# Patient Record
Sex: Female | Born: 1955 | Race: Black or African American | Hispanic: No | Smoking: Never smoker
Health system: Southern US, Community
[De-identification: ages and names within clinical notes are randomized; demographics above are authoritative.]

## PROBLEM LIST (undated history)

## (undated) DIAGNOSIS — I10 Essential (primary) hypertension: Secondary | ICD-10-CM

## (undated) DIAGNOSIS — E119 Type 2 diabetes mellitus without complications: Secondary | ICD-10-CM

## (undated) DIAGNOSIS — E079 Disorder of thyroid, unspecified: Secondary | ICD-10-CM

## (undated) HISTORY — PX: OTHER SURGICAL HISTORY: SHX169

---

## 2018-12-29 ENCOUNTER — Encounter (HOSPITAL_COMMUNITY): Payer: Self-pay

## 2018-12-29 ENCOUNTER — Emergency Department (HOSPITAL_COMMUNITY): Payer: 59

## 2018-12-29 ENCOUNTER — Emergency Department (HOSPITAL_COMMUNITY)
Admission: EM | Admit: 2018-12-29 | Discharge: 2018-12-29 | Disposition: A | Discharge status: Home / Self Care | Payer: 59 | Attending: Emergency Medicine | Admitting: Emergency Medicine

## 2018-12-29 DIAGNOSIS — Z79899 Other long term (current) drug therapy: Secondary | ICD-10-CM | POA: Diagnosis not present

## 2018-12-29 DIAGNOSIS — E119 Type 2 diabetes mellitus without complications: Secondary | ICD-10-CM | POA: Diagnosis not present

## 2018-12-29 DIAGNOSIS — I120 Hypertensive chronic kidney disease with stage 5 chronic kidney disease or end stage renal disease: Secondary | ICD-10-CM | POA: Diagnosis not present

## 2018-12-29 DIAGNOSIS — Z944 Liver transplant status: Secondary | ICD-10-CM | POA: Insufficient documentation

## 2018-12-29 DIAGNOSIS — R51 Headache: Secondary | ICD-10-CM | POA: Insufficient documentation

## 2018-12-29 DIAGNOSIS — Z794 Long term (current) use of insulin: Secondary | ICD-10-CM | POA: Insufficient documentation

## 2018-12-29 DIAGNOSIS — N186 End stage renal disease: Secondary | ICD-10-CM | POA: Insufficient documentation

## 2018-12-29 DIAGNOSIS — R519 Headache, unspecified: Secondary | ICD-10-CM

## 2018-12-29 HISTORY — DX: Essential (primary) hypertension: I10

## 2018-12-29 LAB — CBC WITH DIFFERENTIAL/PLATELET
Abs Immature Granulocytes: 0.03 10*3/uL (ref 0.00–0.07)
Basophils Absolute: 0 10*3/uL (ref 0.0–0.1)
Basophils Relative: 0 %
Eosinophils Absolute: 0 10*3/uL (ref 0.0–0.5)
Eosinophils Relative: 1 %
HCT: 33.9 % — ABNORMAL LOW (ref 36.0–46.0)
HEMOGLOBIN: 9.8 g/dL — AB (ref 12.0–15.0)
Immature Granulocytes: 1 %
Lymphocytes Relative: 16 %
Lymphs Abs: 0.8 10*3/uL (ref 0.7–4.0)
MCH: 22 pg — ABNORMAL LOW (ref 26.0–34.0)
MCHC: 28.9 g/dL — ABNORMAL LOW (ref 30.0–36.0)
MCV: 76 fL — ABNORMAL LOW (ref 80.0–100.0)
MONO ABS: 0.3 10*3/uL (ref 0.1–1.0)
MONOS PCT: 5 %
Neutro Abs: 4 10*3/uL (ref 1.7–7.7)
Neutrophils Relative %: 77 %
Platelets: 172 10*3/uL (ref 150–400)
RBC: 4.46 MIL/uL (ref 3.87–5.11)
RDW: 15.9 % — ABNORMAL HIGH (ref 11.5–15.5)
WBC: 5.2 10*3/uL (ref 4.0–10.5)
nRBC: 0 % (ref 0.0–0.2)

## 2018-12-29 LAB — COMPREHENSIVE METABOLIC PANEL
ALK PHOS: 67 U/L (ref 38–126)
ALT: 14 U/L (ref 0–44)
AST: 17 U/L (ref 15–41)
Albumin: 3.2 g/dL — ABNORMAL LOW (ref 3.5–5.0)
Anion gap: 8 (ref 5–15)
BILIRUBIN TOTAL: 0.5 mg/dL (ref 0.3–1.2)
BUN: 17 mg/dL (ref 8–23)
CALCIUM: 8.7 mg/dL — AB (ref 8.9–10.3)
CO2: 21 mmol/L — ABNORMAL LOW (ref 22–32)
Chloride: 110 mmol/L (ref 98–111)
Creatinine, Ser: 2.35 mg/dL — ABNORMAL HIGH (ref 0.44–1.00)
GFR calc Af Amer: 25 mL/min — ABNORMAL LOW (ref >60)
GFR calc non Af Amer: 21 mL/min — ABNORMAL LOW (ref >60)
Glucose, Bld: 141 mg/dL — ABNORMAL HIGH (ref 70–99)
Potassium: 4.9 mmol/L (ref 3.5–5.1)
Sodium: 139 mmol/L (ref 135–145)
Total Protein: 7.2 g/dL (ref 6.5–8.1)

## 2018-12-29 LAB — TSH: TSH: 0.818 u[IU]/mL (ref 0.350–4.500)

## 2018-12-29 MED ORDER — TETRACAINE HCL 0.5 % OP SOLN
2.0000 [drp] | Freq: Once | OPHTHALMIC | Status: AC
  Administered 2018-12-29: 2 [drp] via OPHTHALMIC
  Filled 2018-12-29: qty 4

## 2018-12-29 MED ORDER — AMLODIPINE BESYLATE 5 MG PO TABS
10.0000 mg | ORAL_TABLET | Freq: Once | ORAL | Status: AC
  Administered 2018-12-29: 10 mg via ORAL
  Filled 2018-12-29: qty 2

## 2018-12-29 MED ORDER — DIPHENHYDRAMINE HCL 50 MG/ML IJ SOLN
12.5000 mg | Freq: Once | INTRAMUSCULAR | Status: AC
  Administered 2018-12-29: 12.5 mg via INTRAVENOUS
  Filled 2018-12-29: qty 1

## 2018-12-29 MED ORDER — LISINOPRIL 10 MG PO TABS
10.0000 mg | ORAL_TABLET | Freq: Once | ORAL | Status: AC
  Administered 2018-12-29: 10 mg via ORAL
  Filled 2018-12-29: qty 1

## 2018-12-29 MED ORDER — METOCLOPRAMIDE HCL 5 MG/ML IJ SOLN
10.0000 mg | Freq: Once | INTRAMUSCULAR | Status: AC
  Administered 2018-12-29: 10 mg via INTRAVENOUS
  Filled 2018-12-29: qty 2

## 2018-12-29 NOTE — ED Notes (Signed)
Pt called for room x1 with no answer

## 2018-12-29 NOTE — ED Notes (Signed)
Patient transported to MRI 

## 2018-12-29 NOTE — ED Provider Notes (Signed)
[facility] EMERGENCY DEPARTMENT Provider Note   CSN: [id number] Arrival date & time: 12/29/18  4970   History   Chief Complaint Chief Complaint  Patient presents with  . Migraine    HPI Tammy Wilcox is a 63 y.o. 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 Patient states light and noise makes the headache worse. Patient reports nothing alleviates the pain. Patient states she has tried tylenol without relief. Patient reports blurry vision in right eye. Patient denies any triggers. Patient states she has not had a headache in 20 years prior to this event. Patient denies trouble ambulating, weakness, numbness, or neck pain. Patient denies nausea, vomiting, or abdominal pain.   HPI  Past Medical History:  Diagnosis Date  . Hypertension     There are no active problems to display for this patient.   History reviewed. No pertinent surgical history.   OB History   No obstetric history on file.      Home Medications    Prior to Admission medications   Medication Sig Start Date End Date Taking? Authorizing Provider  amLODipine (NORVASC) 10 MG tablet Take 10 mg by mouth every evening.   Yes [provider]  atorvastatin (LIPITOR) 10 MG tablet Take 10 mg by mouth daily.   Yes [provider]  bimatoprost (LUMIGAN) 0.01 % SOLN Place 1 drop into both eyes at bedtime.   Yes [provider]  cloNIDine (CATAPRES) 0.1 MG tablet Take 0.1 mg by mouth 2 (two) times daily.   Yes [provider]  cycloSPORINE modified (NEORAL) 25 MG capsule Take 75 mg by mouth every 12 (twelve) hours.   Yes [provider]  DULoxetine (CYMBALTA) 30 MG capsule Take 30 mg by mouth daily.   Yes [provider]  furosemide (LASIX) 40 MG tablet Take 40 mg by  mouth daily.   Yes [provider]  gabapentin (NEURONTIN) 600 MG tablet Take 600 mg by mouth 3 (three) times daily. 09/02/17  Yes [provider]  hydrALAZINE (APRESOLINE) 10 MG tablet Take 10 mg by mouth 3 (three) times daily.   Yes [provider]  insulin glargine (LANTUS) 100 UNIT/ML injection Inject 20 Units into the skin at bedtime.   Yes [provider]  insulin lispro (HUMALOG KWIKPEN) 100 UNIT/ML KwikPen Inject 8 Units into the skin 3 (three) times daily with meals.   Yes [provider]  levothyroxine (SYNTHROID, LEVOTHROID) 112 MCG tablet Take 112 mcg by mouth every morning.   Yes [provider]  lisinopril (PRINIVIL,ZESTRIL) 10 MG tablet Take 10 mg by mouth daily.   Yes [provider]  mycophenolate (MYFORTIC) 180 MG EC tablet Take 360 mg by mouth 2 (two) times daily.   Yes [provider]  omeprazole (PRILOSEC) 20 MG capsule Take 20 mg by mouth daily.   Yes [provider]  tiZANidine (ZANAFLEX) 4 MG tablet Take 4 mg by mouth every 6 (six) hours.   Yes [provider]  traMADol (ULTRAM) 50 MG tablet Take 50 mg by mouth every 6 (six) hours.   Yes [provider]    Family History History reviewed. No pertinent family history.  Social History Social History   Tobacco Use  . Smoking status: Unknown If Ever Smoked  . Smokeless tobacco: Never Used  Substance  Use Topics  . Alcohol use: Never    Frequency: Never  . Drug use: Never     Allergies   Patient has no known allergies.   Review of Systems Review of Systems  Constitutional: Negative for activity change, appetite change, chills, diaphoresis, fatigue, fever and unexpected weight change.  HENT: Negative for congestion, ear pain, sinus pressure and sore throat.   Eyes: Positive for photophobia and visual disturbance.  Respiratory: Negative for shortness of breath.   Cardiovascular: Negative for chest pain.    Gastrointestinal: Negative for abdominal pain, nausea and vomiting.  Musculoskeletal: Negative for gait problem, myalgias, neck pain and neck stiffness.  Skin: Negative for rash.  Allergic/Immunologic: Positive for immunocompromised state.  Neurological: Positive for headaches. Negative for dizziness, seizures, syncope, speech difficulty, weakness and numbness.  Hematological: Does not bruise/bleed easily.  Psychiatric/Behavioral: Negative for sleep disturbance. The patient is not nervous/anxious.     Physical Exam Updated Vital Signs BP (!) 173/77   Pulse (!) 51   Temp 98 F (36.7 C) (Oral)   Resp 18   SpO2 100%   Physical Exam Vitals signs and nursing note reviewed.  Constitutional:      General: She is not in acute distress.    Appearance: She is well-developed. She is not diaphoretic.  HENT:     Head: Normocephalic and atraumatic. No right periorbital erythema or left periorbital erythema.     Right Ear: Tympanic membrane, ear canal and external ear normal.     Left Ear: Tympanic membrane, ear canal and external ear normal.     Nose: Nose normal. No congestion or rhinorrhea.     Mouth/Throat:     Mouth: Mucous membranes are moist.     Pharynx: No oropharyngeal exudate or posterior oropharyngeal erythema.  Eyes:     General:        Right eye: No discharge.        Left eye: No discharge.     Conjunctiva/sclera: Conjunctivae normal.     Right eye: Right conjunctiva is not injected.     Left eye: Left conjunctiva is not injected.  Cardiovascular:     Rate and Rhythm: Normal rate and regular rhythm.     Heart sounds: Normal heart sounds. No murmur. No friction rub. No gallop.   Pulmonary:     Effort: Pulmonary effort is normal. No respiratory distress.     Breath sounds: Normal breath sounds. No wheezing or rales.  Musculoskeletal: Normal range of motion.  Skin:    General: Skin is warm.     Findings: No erythema or rash.  Neurological:     Mental Status: She is  alert and oriented to person, place, and time.   Mental Status:  Alert, oriented, thought content appropriate, able to give a coherent history. Speech fluent without evidence of aphasia. Able to follow 2 step commands without difficulty.  Cranial Nerves:  II:  Peripheral visual fields grossly normal, pupils equal, round, reactive to light III,IV, VI: ptosis not present, EOMs intact. V,VII: smile symmetric, facial light touch sensation equal VIII: hearing grossly normal to voice  X: uvula elevates symmetrically  XI: bilateral shoulder shrug symmetric and strong XII: midline tongue extension without fassiculations Motor:  Normal tone. 5/5 in upper and lower extremities bilaterally including strong and equal grip strength and dorsiflexion/plantar flexion Sensory: light touch normal in all extremities.  Deep Tendon Reflexes: 2+ and symmetric in the biceps and patella Cerebellar: normal finger-to-nose with bilateral upper extremities Gait: normal gait  and balance.  Negative pronator drift. Negative Romberg sign. CV: distal pulses palpable throughout    ED Treatments / Results  Labs (all labs ordered are listed, but only abnormal results are displayed) Labs Reviewed  CBC WITH DIFFERENTIAL/PLATELET - Abnormal; Notable for the following components:      Result Value   Hemoglobin 9.8 (*)    HCT 33.9 (*)    MCV 76.0 (*)    MCH 22.0 (*)    MCHC 28.9 (*)    RDW 15.9 (*)    All other components within normal limits  COMPREHENSIVE METABOLIC PANEL - Abnormal; Notable for the following components:   CO2 21 (*)    Glucose, Bld 141 (*)    Creatinine, Ser 2.35 (*)    Calcium 8.7 (*)    Albumin 3.2 (*)    GFR calc non Af Amer 21 (*)    GFR calc Af Amer 25 (*)    All other components within normal limits  TSH    EKG None  Radiology Ct Head Wo Contrast  Result Date: 12/29/2018 CLINICAL DATA:  Right-sided headache for 2-3 weeks. Right-sided blurred vision. EXAM: CT HEAD WITHOUT CONTRAST  TECHNIQUE: Contiguous axial images were obtained from the base of the skull through the vertex without intravenous contrast. COMPARISON:  None. FINDINGS: Brain: No evidence of acute infarction, hemorrhage, hydrocephalus, extra-axial collection or mass lesion/mass effect. The pituitary gland appears fatty replaced. Vascular: Calcific atherosclerotic disease of the intra cavernous carotid arteries. Skull: Normal. Negative for fracture or focal lesion. Sinuses/Orbits: No acute finding. Other: None. IMPRESSION: No evidence of acute infarction or intracranial hemorrhage. The pituitary gland appears fatty replaced. Further evaluation with dedicated brain MRI may be considered. Calcific atherosclerotic disease of the intra cavernous carotid arteries. Electronically Signed   By: Fidela Salisbury M.D.   On: 12/29/2018 11:31   Mr Brain Wo Contrast (neuro Protocol)  Result Date: 12/29/2018 CLINICAL DATA:  Headache for 2 weeks. History of migraines and hypertension. EXAM: MRI HEAD WITHOUT CONTRAST MRA HEAD WITHOUT CONTRAST TECHNIQUE: Multiplanar, multiecho pulse sequences of the brain and surrounding structures were obtained without intravenous contrast. Angiographic images of the head were obtained using MRA technique without contrast. COMPARISON:  Head CT 12/29/2018 FINDINGS: MRI HEAD FINDINGS Brain: There is no evidence of acute infarct, intracranial hemorrhage, mass, midline shift, or extra-axial fluid collection. The ventricles and sulci are within normal limits for age. Scattered small foci of T2 hyperintensity in the cerebral white matter and pons are nonspecific but compatible with minimal chronic small vessel ischemic disease. There is a moderately expanded partially empty sella which accounts for the abnormal appearance on earlier CT. Vascular: Major intracranial vascular flow voids are preserved. Skull and upper cervical spine: Unremarkable bone marrow signal. Sinuses/Orbits: Bilateral exophthalmos. Small left  mastoid effusion. Other: None. MRA HEAD FINDINGS The visualized distal vertebral arteries are widely patent to the basilar and codominant. Patent P ICAs are partially imaged bilaterally. AICAs are small and not well evaluated. SCAs are patent bilaterally. The basilar artery is patent without stenosis. There are moderately large posterior communicating arteries bilaterally. Both PCAs are patent without evidence of significant stenosis. The internal carotid arteries are widely patent from skull base to carotid termini. ACAs and MCAs are patent without evidence of proximal branch occlusion or significant stenosis. No aneurysm is identified. IMPRESSION: 1. No acute intracranial abnormality. 2. Minimal chronic small vessel ischemic disease. 3. Partially empty sella, often an incidental finding though can be seen with idiopathic intracranial hypertension. 4. Negative  head MRA. Electronically Signed   By: Logan Bores M.D.   On: 12/29/2018 16:54   Mr Jodene Nam Head (cerebral Arteries)  Result Date: 12/29/2018 CLINICAL DATA:  Headache for 2 weeks. History of migraines and hypertension. EXAM: MRI HEAD WITHOUT CONTRAST MRA HEAD WITHOUT CONTRAST TECHNIQUE: Multiplanar, multiecho pulse sequences of the brain and surrounding structures were obtained without intravenous contrast. Angiographic images of the head were obtained using MRA technique without contrast. COMPARISON:  Head CT 12/29/2018 FINDINGS: MRI HEAD FINDINGS Brain: There is no evidence of acute infarct, intracranial hemorrhage, mass, midline shift, or extra-axial fluid collection. The ventricles and sulci are within normal limits for age. Scattered small foci of T2 hyperintensity in the cerebral white matter and pons are nonspecific but compatible with minimal chronic small vessel ischemic disease. There is a moderately expanded partially empty sella which accounts for the abnormal appearance on earlier CT. Vascular: Major intracranial vascular flow voids are preserved.  Skull and upper cervical spine: Unremarkable bone marrow signal. Sinuses/Orbits: Bilateral exophthalmos. Small left mastoid effusion. Other: None. MRA HEAD FINDINGS The visualized distal vertebral arteries are widely patent to the basilar and codominant. Patent P ICAs are partially imaged bilaterally. AICAs are small and not well evaluated. SCAs are patent bilaterally. The basilar artery is patent without stenosis. There are moderately large posterior communicating arteries bilaterally. Both PCAs are patent without evidence of significant stenosis. The internal carotid arteries are widely patent from skull base to carotid termini. ACAs and MCAs are patent without evidence of proximal branch occlusion or significant stenosis. No aneurysm is identified. IMPRESSION: 1. No acute intracranial abnormality. 2. Minimal chronic small vessel ischemic disease. 3. Partially empty sella, often an incidental finding though can be seen with idiopathic intracranial hypertension. 4. Negative head MRA. Electronically Signed   By: Logan Bores M.D.   On: 12/29/2018 16:54    Procedures Procedures (including critical care time)  Medications Ordered in ED Medications  lisinopril (PRINIVIL,ZESTRIL) tablet 10 mg (10 mg Oral Given 12/29/18 1249)  amLODipine (NORVASC) tablet 10 mg (10 mg Oral Given 12/29/18 1249)  metoCLOPramide (REGLAN) injection 10 mg (10 mg Intravenous Given 12/29/18 1306)  diphenhydrAMINE (BENADRYL) injection 12.5 mg (12.5 mg Intravenous Given 12/29/18 1305)  tetracaine (PONTOCAINE) 0.5 % ophthalmic solution 2 drop (2 drops Both Eyes Given 12/29/18 1342)     Initial Impression / Assessment and Plan / ED Course  I have reviewed the triage vital signs and the nursing notes.  Pertinent labs & imaging results that were available during my care of the patient were reviewed by me and considered in my medical decision making (see chart for details).  Clinical Course as of Dec 29 1710  Thu Dec 29, 2018  1221 BP  elevated. Patient reports she has not taken her BP medications today.   BP(!): 164/76 [AH]  1222 No evidence of acute infarction or intracranial hemorrhage on head CT.  CT Head Wo Contrast [AH]  1334 Tonometry reveals a pressure of 14, 16 on Right eye and 16, 18 on Left eye.    [AH]  1335 Pt reports symptoms have improved while in the ER.   [AH]  1507 Elevated creatinine likely due to ESRD. Patient is being followed by her nephrologist.  Creatinine(!): 2.35 [AH]  1658 Negative head MRA.  MR MRA Head (cerebral arteries) [AH]  1658 No acute intracranial abnormality.  MR Brain Wo Contrast (neuro protocol) [AH]    Clinical Course User Index [AH] Jerilee Hoh, Geroge Gilliam P, PA-C   Pt HA treated  and improved while in ED.  Presentation is like pts migraine and non concerning for [facility], ICH, Meningitis, or temporal arteritis. CT head does not reveal acute infarction or intracranial hemorrhage. MRI reveals no acute intracranial abnormality and MRA is negative. Pt is afebrile with no focal neuro deficits, or nuchal rigidity. Pt is to follow up with PCP to discuss prophylactic medication. Pt verbalizes understanding and is agreeable with plan to dc.   Findings and plan of care discussed with supervising physician Dr. Johnney Killian who personally evaluated and examined this patient.  Findings and plan of care also discussed with supervising physician Dr. Melina Copa.  Final Clinical Impressions(s) / ED Diagnoses   Final diagnoses:  Bad headache    ED Discharge Orders    None       Arville Lime, [place] 12/29/18 1712    Hayden Rasmussen, MD 12/29/18 2320

## 2018-12-29 NOTE — ED Triage Notes (Signed)
Pt endorses migraine x 2 weeks. Has hx of migraines but hasn't had one in a long time. Endorses light sensitivity. No neuro deficits. Hypertensive and has hx of same.

## 2018-12-29 NOTE — Discharge Instructions (Addendum)
You have been seen today for headache. Please read and follow all provided instructions.   1. Medications: usual home medications 2. Treatment: rest, drink plenty of fluids 3. Follow Up: Please follow up with your primary doctor in 2 days for discussion of your diagnoses and further evaluation after today's visit; if you do not have a primary care doctor use the resource guide provided to find one; Please return to the ER for any new or worsening symptoms. Please obtain all of your results from medical records or have your doctors office obtain the results - share them with your doctor - you should be seen at your doctors office. Call today to arrange your follow up.   Take medications as prescribed. Please review all of the medicines and only take them if you do not have an allergy to them. Return to the emergency room for worsening condition or new concerning symptoms. Follow up with your regular doctor. If you don't have a regular doctor use one of the numbers below to establish a primary care doctor. ?  It is also a possibility that you have an allergic reaction to any of the medicines that you have been prescribed - Everybody reacts differently to medications and while MOST people have no trouble with most medicines, you may have a reaction such as nausea, vomiting, rash, swelling, shortness of breath. If this is the case, please stop taking the medicine immediately and contact your physician.  ?  You should return to the ER if you develop severe or worsening symptoms.   Emergency Department Resource Guide 1) Find a Doctor and Pay Out of Pocket Although you won't have to find out who is covered by your insurance plan, it is a good idea to ask around and get recommendations. You will then need to call the office and see if the doctor you have chosen will accept you as a new patient and what types of options they offer for patients who are self-pay. Some doctors offer discounts or will set up  payment plans for their patients who do not have insurance, but you will need to ask so you aren't surprised when you get to your appointment.  2) Contact Your Local Health Department Not all health departments have doctors that can see patients for sick visits, but many do, so it is worth a call to see if yours does. If you don't know where your local health department is, you can check in your phone book. The CDC also has a tool to help you locate your state's health department, and many state websites also have listings of all of their local health departments.  3) Find a [facility] If your illness is not likely to be very severe or complicated, you may want to try a walk in clinic. These are popping up all over the country in pharmacies, drugstores, and shopping centers. They're usually staffed by nurse practitioners or physician assistants that have been trained to treat common illnesses and complaints. They're usually fairly quick and inexpensive. However, if you have serious medical issues or chronic medical problems, these are probably not your best option.  No Primary Care Doctor: Call Health Connect at  [phone] - they can help you locate a primary care doctor that  accepts your insurance, provides certain services, etc. Physician Referral Service- [phone]  Emergency Department Resource Guide 1) Find a Doctor and Pay Out of Pocket Although you won't have to find out who is covered by your insurance plan,  it is a good idea to ask around and get recommendations. You will then need to call the office and see if the doctor you have chosen will accept you as a new patient and what types of options they offer for patients who are self-pay. Some doctors offer discounts or will set up payment plans for their patients who do not have insurance, but you will need to ask so you aren't surprised when you get to your appointment.  2) Contact Your Local Health Department Not all health  departments have doctors that can see patients for sick visits, but many do, so it is worth a call to see if yours does. If you don't know where your local health department is, you can check in your phone book. The CDC also has a tool to help you locate your state's health department, and many state websites also have listings of all of their local health departments.  3) Find a [facility] If your illness is not likely to be very severe or complicated, you may want to try a walk in clinic. These are popping up all over the country in pharmacies, drugstores, and shopping centers. They're usually staffed by nurse practitioners or physician assistants that have been trained to treat common illnesses and complaints. They're usually fairly quick and inexpensive. However, if you have serious medical issues or chronic medical problems, these are probably not your best option.  No Primary Care Doctor: Call Health Connect at  [phone] - they can help you locate a primary care doctor that  accepts your insurance, provides certain services, etc. Physician Referral Service- [phone]  Chronic Pain Problems: Organization         Address  Phone   Notes  [facility]  [phone] Patients need to be referred by their primary care doctor.   Medication Assistance: Organization         Address  Phone   Notes  [facility] Medication [facility] [address] [place], [place] [address] [phone] --Must be a resident of [facility] -- Must have NO insurance coverage whatsoever (no Medicaid/ Medicare, etc.) -- The pt. MUST have a primary care doctor that directs their care regularly and follows them in the community   [facility]  [phone]   [employer]  [phone]    Agencies that provide inexpensive medical care: Organization         Address  Phone   Notes  [place]  [phone]   [facility]    [phone]   [facility] [place], [place] [address] [phone]   [place] [address] [place] [phone]   [facility]anned Parenthood    [phone]   [facility]    [phone]   [place] and [address], [place] Phone:  [phone], Fax:  [phone] Hours of Operation:  9 am - 6 pm, M-F.  Also accepts Medicaid/Medicare and self-pay.  [facility] for [address] [place], [address], [place] Phone: [phone], Fax: [phone]. Hours of Operation:  8:30 am - 5:30 pm, M-F.  Also accepts Medicaid and self-pay.  [facility] [address] [place] Phone: [phone]   [place], [place], [place] [phone], Ext. [phone] Mondays & Thursdays: 7-9 AM.  First 15 patients are seen on a first come, first serve basis.    [place] Providers:  Organization         Address  Phone   Notes  [facility] [address], [place] [phone] Also accepts self-pay patients.  [facility] [address], [place]  [phone]   [place], [address], [place] [phone]   [facility] [address] [place] [phone]   Lucianne Lei [address], [place]   [phone] Only accepts [place] [facility] [place] patients after they have their name applied to their card.   Self-Pay (no insurance) in [facility]:  Organization         Address  Phone   Notes  Sickle Cell Patients, [facility] [place] [phone]   [facility] [place] [phone]   [facility] [place]  [place], [place], [place] [phone]   [facility]/Dr. Osei-Bonsu  [address], [place] or [address], [place] [phone] Phone number for both [place] and [place] locations is the same.  [facility] and [facility] [address] [place] [phone]   [facility] [address] [place] or [address] [phone]   [facility] [address], [place] [phone], phone; [phone], fax Sees patients 1st and 3rd Saturday of every month.  Must not qualify for public or private insurance (i.e. Medicaid, Medicare, [place] Health Choice, Veterans' Benefits)  Household income should be no more than 200% of the poverty level The clinic cannot treat you if you are pregnant or think you are pregnant  Sexually transmitted diseases are not treated at the clinic.

## 2018-12-29 NOTE — ED Notes (Signed)
Patient verbalizes understanding of discharge instructions. Opportunity for questioning and answers were provided. Armband removed by staff, pt discharged from ED ambulatory.   

## 2018-12-29 NOTE — ED Provider Notes (Addendum)
Medical screening examination/treatment/procedure(s) were conducted as a shared visit with non-physician practitioner(s) and myself.  I personally evaluated the patient during the encounter.  None Patient reports he started with a headache on the right side of her head a couple weeks ago.  It had somewhat of a gradual onset.  She reports it continued however got to be quite severe.  She is had a sharp and bad headache concentrating behind her right eye.  It does wax and wane in severity somewhat.  She does feel that the vision is blurred.  She denies she is had any double vision or loss of vision.  There has not been nausea or vomiting.  There has not been other incoordination or imbalance.  No weakness no numbness no tingling.  She reports that she used to get migraines a long time ago but does not suffer from frequent or recurrent headaches.  Patient is alert and appropriate.  Mental status is clear.  She does not have facial swelling or periorbital swelling.  The pupils are symmetric and responsive to light.  Extraocular motions are intact.  No facial lesions.  Movements are coordinated purposeful symmetric.   Charlesetta Shanks, MD 12/29/18 1358    Charlesetta Shanks, MD 01/07/19 2348

## 2020-10-06 ENCOUNTER — Other Ambulatory Visit: Payer: Self-pay

## 2020-10-06 ENCOUNTER — Encounter (HOSPITAL_COMMUNITY): Payer: Self-pay | Admitting: Emergency Medicine

## 2020-10-06 ENCOUNTER — Emergency Department (HOSPITAL_COMMUNITY)
Admission: EM | Admit: 2020-10-06 | Discharge: 2020-10-06 | Disposition: A | Discharge status: Home / Self Care | Payer: 59 | Attending: Emergency Medicine | Admitting: Emergency Medicine

## 2020-10-06 ENCOUNTER — Emergency Department (HOSPITAL_COMMUNITY): Payer: 59

## 2020-10-06 DIAGNOSIS — I10 Essential (primary) hypertension: Secondary | ICD-10-CM | POA: Diagnosis not present

## 2020-10-06 DIAGNOSIS — Z79899 Other long term (current) drug therapy: Secondary | ICD-10-CM | POA: Diagnosis not present

## 2020-10-06 DIAGNOSIS — Z794 Long term (current) use of insulin: Secondary | ICD-10-CM | POA: Diagnosis not present

## 2020-10-06 DIAGNOSIS — K0889 Other specified disorders of teeth and supporting structures: Secondary | ICD-10-CM | POA: Diagnosis present

## 2020-10-06 DIAGNOSIS — K047 Periapical abscess without sinus: Secondary | ICD-10-CM

## 2020-10-06 LAB — CBC WITH DIFFERENTIAL/PLATELET
Abs Immature Granulocytes: 0.05 10*3/uL (ref 0.00–0.07)
Basophils Absolute: 0 10*3/uL (ref 0.0–0.1)
Basophils Relative: 0 %
Eosinophils Absolute: 0 10*3/uL (ref 0.0–0.5)
Eosinophils Relative: 0 %
HCT: 33.6 % — ABNORMAL LOW (ref 36.0–46.0)
Hemoglobin: 9.7 g/dL — ABNORMAL LOW (ref 12.0–15.0)
Immature Granulocytes: 0 %
Lymphocytes Relative: 5 %
Lymphs Abs: 0.6 10*3/uL — ABNORMAL LOW (ref 0.7–4.0)
MCH: 21.7 pg — ABNORMAL LOW (ref 26.0–34.0)
MCHC: 28.9 g/dL — ABNORMAL LOW (ref 30.0–36.0)
MCV: 75 fL — ABNORMAL LOW (ref 80.0–100.0)
Monocytes Absolute: 0.7 10*3/uL (ref 0.1–1.0)
Monocytes Relative: 6 %
Neutro Abs: 10.1 10*3/uL — ABNORMAL HIGH (ref 1.7–7.7)
Neutrophils Relative %: 89 %
Platelets: 186 10*3/uL (ref 150–400)
RBC: 4.48 MIL/uL (ref 3.87–5.11)
RDW: 15.9 % — ABNORMAL HIGH (ref 11.5–15.5)
WBC: 11.4 10*3/uL — ABNORMAL HIGH (ref 4.0–10.5)
nRBC: 0 % (ref 0.0–0.2)

## 2020-10-06 LAB — BASIC METABOLIC PANEL
Anion gap: 14 (ref 5–15)
BUN: 35 mg/dL — ABNORMAL HIGH (ref 8–23)
CO2: 14 mmol/L — ABNORMAL LOW (ref 22–32)
Calcium: 9.2 mg/dL (ref 8.9–10.3)
Chloride: 110 mmol/L (ref 98–111)
Creatinine, Ser: 2.96 mg/dL — ABNORMAL HIGH (ref 0.44–1.00)
GFR, Estimated: 16 mL/min — ABNORMAL LOW (ref >60)
Glucose, Bld: 146 mg/dL — ABNORMAL HIGH (ref 70–99)
Potassium: 5.1 mmol/L (ref 3.5–5.1)
Sodium: 138 mmol/L (ref 135–145)

## 2020-10-06 MED ORDER — CLINDAMYCIN HCL 150 MG PO CAPS: 150.0000 mg | ORAL_CAPSULE | Freq: Four times a day (QID) | ORAL | 0 refills | Status: DC

## 2020-10-06 MED ORDER — LIDOCAINE VISCOUS HCL 2 % MT SOLN: 15.0000 mL | OROMUCOSAL | 0 refills | Status: DC | PRN

## 2020-10-06 MED ORDER — CLINDAMYCIN PHOSPHATE 600 MG/50ML IV SOLN
600.0000 mg | Freq: Once | INTRAVENOUS | Status: AC
  Administered 2020-10-06: 600 mg via INTRAVENOUS
  Filled 2020-10-06: qty 50

## 2020-10-06 MED ORDER — ONDANSETRON HCL 4 MG/2ML IJ SOLN
4.0000 mg | Freq: Once | INTRAMUSCULAR | Status: AC
  Administered 2020-10-06: 4 mg via INTRAVENOUS
  Filled 2020-10-06: qty 2

## 2020-10-06 MED ORDER — MORPHINE SULFATE (PF) 4 MG/ML IV SOLN
4.0000 mg | Freq: Once | INTRAVENOUS | Status: AC
  Administered 2020-10-06: 4 mg via INTRAVENOUS
  Filled 2020-10-06: qty 1

## 2020-10-06 MED ORDER — IBUPROFEN 600 MG PO TABS: 600.0000 mg | ORAL_TABLET | Freq: Four times a day (QID) | ORAL | 0 refills | Status: DC | PRN

## 2020-10-06 MED ORDER — MORPHINE SULFATE (PF) 4 MG/ML IV SOLN
6.0000 mg | Freq: Once | INTRAVENOUS | Status: AC
  Administered 2020-10-06: 6 mg via INTRAMUSCULAR
  Filled 2020-10-06 (×2): qty 2

## 2020-10-06 NOTE — Discharge Instructions (Addendum)
Take antibiotic as prescribed.  Call and follow-up closely with dentist tomorrow for further care.  Return if you have any concerns.

## 2020-10-06 NOTE — ED Triage Notes (Signed)
C/o R upper and lower toothache since yesterday.

## 2020-10-06 NOTE — ED Provider Notes (Signed)
[facility] DEPARTMENT Provider Note   CSN: [id number] Arrival date & time: 10/06/20  1962     History Chief Complaint  Patient presents with  . Dental Pain    Tammy Wilcox is a 64 y.o. female.  The history is provided by the patient. No language interpreter was used.  Dental Pain Associated symptoms: facial swelling   Associated symptoms: no fever      64 year old female significant history of hypertension brought here via EMS with complaints of dental pain.  Patient report for the past 2 days she has had progressive worsening pain involving right upper and lower molar.  Pain is sharp throbbing achy severe and now she has noticed facial swelling.  She is having trouble tolerating her saliva and have to spit it out and having trouble opening her jaw.  She does not complain of fever chills no chest pain or shortness of breath no significant neck pain.  She does have a dentist but have not been able to follow-up yet.  She denies any recent injury.  Does have history of diabetes.  Past Medical History:  Diagnosis Date  . Hypertension     There are no problems to display for this patient.   History reviewed. No pertinent surgical history.   OB History   No obstetric history on file.     No family history on file.  Social History   Tobacco Use  . Smoking status: Unknown If Ever Smoked  . Smokeless tobacco: Never Used  Substance Use Topics  . Alcohol use: Never  . Drug use: Never    Home Medications Prior to Admission medications   Medication Sig Start Date End Date Taking? Authorizing Provider  amLODipine (NORVASC) 10 MG tablet Take 10 mg by mouth every evening.    [provider]  atorvastatin (LIPITOR) 10 MG tablet Take 10 mg by mouth daily.    [provider]  bimatoprost (LUMIGAN) 0.01 % SOLN Place 1 drop into both eyes at bedtime.    [provider]  cloNIDine (CATAPRES) 0.1 MG tablet Take 0.1 mg by mouth  2 (two) times daily.    [provider]  cycloSPORINE modified (NEORAL) 25 MG capsule Take 75 mg by mouth every 12 (twelve) hours.    [provider]  DULoxetine (CYMBALTA) 30 MG capsule Take 30 mg by mouth daily.    [provider]  furosemide (LASIX) 40 MG tablet Take 40 mg by mouth daily.    [provider]  gabapentin (NEURONTIN) 600 MG tablet Take 600 mg by mouth 3 (three) times daily. 09/02/17   [provider]  hydrALAZINE (APRESOLINE) 10 MG tablet Take 10 mg by mouth 3 (three) times daily.    [provider]  insulin glargine (LANTUS) 100 UNIT/ML injection Inject 20 Units into the skin at bedtime.    [provider]  insulin lispro (HUMALOG KWIKPEN) 100 UNIT/ML KwikPen Inject 8 Units into the skin 3 (three) times daily with meals.    [provider]  levothyroxine (SYNTHROID, LEVOTHROID) 112 MCG tablet Take 112 mcg by mouth every morning.    [provider]  lisinopril (PRINIVIL,ZESTRIL) 10 MG tablet Take 10 mg by mouth daily.    [provider]  mycophenolate (MYFORTIC) 180 MG EC tablet Take 360 mg by mouth 2 (two) times daily.    [provider]  omeprazole (PRILOSEC) 20 MG capsule Take 20 mg by mouth daily.    [provider]  tiZANidine (ZANAFLEX) 4 MG tablet Take 4 mg by mouth every 6 (six) hours.    [provider]  traMADol (ULTRAM) 50 MG tablet Take 50 mg by mouth every 6 (six) hours.    [provider]    Allergies    Patient has no known allergies.  Review of Systems   Review of Systems  Constitutional: Negative for fever.  HENT: Positive for dental problem, facial swelling and trouble swallowing. Negative for sore throat.   All other systems reviewed and are negative.   Physical Exam Updated Vital Signs BP (!) 180/73 (BP Location: Left Arm)   Pulse 66   Temp 99.4 F (37.4 C) (Oral)   Resp 17   Ht 5\' 6"  (1.676 m)   Wt 95.7 kg   SpO2 100%    BMI 34.06 kg/m   Physical Exam Vitals and nursing note reviewed.  Constitutional:      General: She is not in acute distress.    Appearance: She is well-developed.     Comments: Patient appears tearful.  HENT:     Head: Atraumatic.     Mouth/Throat:     Comments: Exam is limited due to trismus.  Tenderness to right upper and lower jaw with mild edema noted.  Decay right upper and lower molar appreciated.  Adjacent facial swelling.  Throat exam unremarkable. Eyes:     Conjunctiva/sclera: Conjunctivae normal.  Cardiovascular:     Rate and Rhythm: Normal rate and regular rhythm.     Pulses: Normal pulses.     Heart sounds: Normal heart sounds.  Pulmonary:     Effort: Pulmonary effort is normal.     Breath sounds: Normal breath sounds.  Abdominal:     Palpations: Abdomen is soft.     Tenderness: There is no abdominal tenderness.  Musculoskeletal:     Cervical back: Neck supple.  Lymphadenopathy:     Cervical: Cervical adenopathy present.  Skin:    Findings: No rash.  Neurological:     Mental Status: She is alert.     ED Results / Procedures / Treatments   Labs (all labs ordered are listed, but only abnormal results are displayed) Labs Reviewed  CBC WITH DIFFERENTIAL/PLATELET - Abnormal; Notable for the following components:      Result Value   WBC 11.4 (*)    Hemoglobin 9.7 (*)    HCT 33.6 (*)    MCV 75.0 (*)    MCH 21.7 (*)    MCHC 28.9 (*)    RDW 15.9 (*)    Neutro Abs 10.1 (*)    Lymphs Abs 0.6 (*)    All other components within normal limits  BASIC METABOLIC PANEL - Abnormal; Notable for the following components:   CO2 14 (*)    Glucose, Bld 146 (*)    BUN 35 (*)    Creatinine, Ser 2.96 (*)    GFR, Estimated 16 (*)    All other components within normal limits    EKG None  Radiology CT SOFT TISSUE NECK WO CONTRAST  Result Date: 10/06/2020 CLINICAL DATA:  Neck abscess, difficulty swallowing. EXAM: CT NECK WITHOUT CONTRAST TECHNIQUE: Multidetector CT  imaging of the neck was performed following the standard protocol without intravenous contrast. COMPARISON:  None. FINDINGS: Please note that lack of intravenous contrast limits evaluation. Pharynx and larynx: Dental amalgam beam hardening artifact limits evaluation. Phlegmonous changes are seen along the superior right alveolar ridge at the expected location of the first and second molars, which  are not present. There is an adjacent focal cortical defect (7:26) involving the lateral upper right alveolar ridge. There may be an adjacent focus of free fluid without a definite wall (3: 23). Minimal periapical lucency involving the right lower central incisor. No significant periapical lucency involving the upper teeth roots. Salivary glands: No inflammation, mass, or stone. Thyroid: Normal. Lymph nodes: Prominent to mildly enlarged right level 1B and 2/3 nodes are likely reactive. Vascular: Calcified atheromatous plaque involving the aortic arch and great vessel origins. Carotid bifurcation calcified atheromatous disease. Limited intracranial: No acute finding. Visualized orbits: Normal orbits. Mastoids and visualized paranasal sinuses: Clear paranasal sinuses. No mastoid effusion. Skeleton: No acute osseous abnormality. Superior alveolar ridge defect discussed above. Upper chest: Dependent atelectasis. Other: None. IMPRESSION: Please note lack of intravenous contrast limits evaluation. Phlegmonous changes and free fluid along the superior right alveolar ridge with adjacent focal cortical defect. No definite walled-off fluid collection. Reactive right cervical adenopathy. 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 Electronically Signed   By: Primitivo Gauze M.D.   On: 10/06/2020 13:28    Procedures Procedures (including critical care time)  Medications Ordered in ED Medications  clindamycin (CLEOCIN) IVPB 600 mg (0 mg Intravenous Stopped 10/06/20 1204)  morphine 4 MG/ML injection 4 mg (4 mg  Intravenous Given 10/06/20 1206)  ondansetron (ZOFRAN) injection 4 mg (4 mg Intravenous Given 10/06/20 1127)  morphine 4 MG/ML injection 6 mg (6 mg Intramuscular Given 10/06/20 1037)    ED Course  I have reviewed the triage vital signs and the nursing notes.  Pertinent labs & imaging results that were available during my care of the patient were reviewed by me and considered in my medical decision making (see chart for details).    MDM Rules/Calculators/A&P                          BP (!) 180/73 (BP Location: Left Arm)   Pulse 66   Temp 99.4 F (37.4 C) (Oral)   Resp 17   Ht 5\' 6"  (1.676 m)   Wt 95.7 kg   SpO2 100%   BMI 34.06 kg/m   Final Clinical Impression(s) / ED Diagnoses Final diagnoses:  Periapical abscess with facial involvement    Rx / DC Orders ED Discharge Orders         Ordered    clindamycin (CLEOCIN) 150 MG capsule  Every 6 hours        10/06/20 1356    lidocaine (XYLOCAINE) 2 % solution  As needed        10/06/20 1356    ibuprofen (ADVIL) 600 MG tablet  Every 6 hours PRN        10/06/20 1356         9:34 AM Presents with dental pain suggestive of periapical abscess with facial involvement.  Patient however is having trismus, spitting up spits into a bag and appears uncomfortable.  She does have history of diabetes therefore will obtain maxillofacial CT scan with portion of neck to rule out deep tissue infection.  Will initiate antibiotic including clindamycin via IV, pain medication given as well as antinausea medication given.  Care discussed with Dr. Maryan Rued.  1:37 PM Labs remarkable for impaired renal function with BUN 35, creatinine 2.96 however fairly similar from values a year ago.  White count mildly elevated at 11.4.  Neck soft tissue to CT scan showed phlegmonous changes and free fluid along the superior right alveolar ridge  with adjacent focal cortical defect but no obvious abscess noted.  No airway compromise.  1:53 PM Pt feels better.   Will d/c with pain medication, antibiotic, and dental f/u.  Return precaution given.   Patient was given clindamycin via IV in the ED as well as pain medication.  Her blood pressure is noted to be elevated at 180/73 likely secondary to pain.     Domenic Moras, PA-C 10/06/20 1357    Blanchie Dessert, MD 10/06/20 [phone]

## 2021-01-14 ENCOUNTER — Encounter (HOSPITAL_COMMUNITY): Payer: Self-pay

## 2021-01-14 ENCOUNTER — Other Ambulatory Visit: Payer: Self-pay

## 2021-01-14 ENCOUNTER — Emergency Department (HOSPITAL_COMMUNITY)
Admission: EM | Admit: 2021-01-14 | Discharge: 2021-01-15 | Disposition: A | Discharge status: Home / Self Care | Payer: 59 | Attending: Emergency Medicine | Admitting: Emergency Medicine

## 2021-01-14 DIAGNOSIS — R42 Dizziness and giddiness: Secondary | ICD-10-CM | POA: Diagnosis not present

## 2021-01-14 DIAGNOSIS — R590 Localized enlarged lymph nodes: Secondary | ICD-10-CM | POA: Diagnosis not present

## 2021-01-14 DIAGNOSIS — Z5321 Procedure and treatment not carried out due to patient leaving prior to being seen by health care provider: Secondary | ICD-10-CM | POA: Insufficient documentation

## 2021-01-14 DIAGNOSIS — R002 Palpitations: Secondary | ICD-10-CM | POA: Diagnosis not present

## 2021-01-14 NOTE — ED Triage Notes (Signed)
Patient reports "knot under her neck on R side", reports painful to swallow, also reports she is having dizziness and heart palpitations.

## 2021-01-15 LAB — URINALYSIS, ROUTINE W REFLEX MICROSCOPIC
Bilirubin Urine: NEGATIVE
Glucose, UA: NEGATIVE mg/dL
Hgb urine dipstick: NEGATIVE
Ketones, ur: NEGATIVE mg/dL
Nitrite: NEGATIVE
Protein, ur: 30 mg/dL — AB
Specific Gravity, Urine: 1.015 (ref 1.005–1.030)
pH: 5 (ref 5.0–8.0)

## 2021-01-15 LAB — CBC
HCT: 31.1 % — ABNORMAL LOW (ref 36.0–46.0)
Hemoglobin: 8.7 g/dL — ABNORMAL LOW (ref 12.0–15.0)
MCH: 20.9 pg — ABNORMAL LOW (ref 26.0–34.0)
MCHC: 28 g/dL — ABNORMAL LOW (ref 30.0–36.0)
MCV: 74.8 fL — ABNORMAL LOW (ref 80.0–100.0)
Platelets: 185 10*3/uL (ref 150–400)
RBC: 4.16 MIL/uL (ref 3.87–5.11)
RDW: 18.2 % — ABNORMAL HIGH (ref 11.5–15.5)
WBC: 5.8 10*3/uL (ref 4.0–10.5)
nRBC: 0 % (ref 0.0–0.2)

## 2021-01-15 LAB — BASIC METABOLIC PANEL
Anion gap: 12 (ref 5–15)
BUN: 34 mg/dL — ABNORMAL HIGH (ref 8–23)
CO2: 16 mmol/L — ABNORMAL LOW (ref 22–32)
Calcium: 8.6 mg/dL — ABNORMAL LOW (ref 8.9–10.3)
Chloride: 110 mmol/L (ref 98–111)
Creatinine, Ser: 2.81 mg/dL — ABNORMAL HIGH (ref 0.44–1.00)
GFR, Estimated: 18 mL/min — ABNORMAL LOW (ref >60)
Glucose, Bld: 228 mg/dL — ABNORMAL HIGH (ref 70–99)
Potassium: 4.4 mmol/L (ref 3.5–5.1)
Sodium: 138 mmol/L (ref 135–145)

## 2021-01-15 NOTE — ED Notes (Signed)
Patient called for vitals recheck with no response and not visible in lobby

## 2021-01-16 ENCOUNTER — Encounter (HOSPITAL_COMMUNITY): Payer: Self-pay

## 2021-01-16 ENCOUNTER — Other Ambulatory Visit: Payer: Self-pay

## 2021-01-16 ENCOUNTER — Emergency Department (HOSPITAL_COMMUNITY): Payer: 59

## 2021-01-16 ENCOUNTER — Ambulatory Visit (HOSPITAL_COMMUNITY)
Admission: EM | Admit: 2021-01-16 | Discharge: 2021-01-16 | Disposition: A | Discharge status: Home / Self Care | Payer: 59

## 2021-01-16 ENCOUNTER — Emergency Department (HOSPITAL_COMMUNITY)
Admission: EM | Admit: 2021-01-16 | Discharge: 2021-01-16 | Disposition: A | Discharge status: Home / Self Care | Payer: 59 | Attending: Emergency Medicine | Admitting: Emergency Medicine

## 2021-01-16 DIAGNOSIS — R001 Bradycardia, unspecified: Secondary | ICD-10-CM | POA: Diagnosis not present

## 2021-01-16 DIAGNOSIS — Z79899 Other long term (current) drug therapy: Secondary | ICD-10-CM | POA: Diagnosis not present

## 2021-01-16 DIAGNOSIS — R42 Dizziness and giddiness: Secondary | ICD-10-CM

## 2021-01-16 DIAGNOSIS — Z20822 Contact with and (suspected) exposure to covid-19: Secondary | ICD-10-CM | POA: Insufficient documentation

## 2021-01-16 DIAGNOSIS — R7989 Other specified abnormal findings of blood chemistry: Secondary | ICD-10-CM | POA: Diagnosis not present

## 2021-01-16 DIAGNOSIS — I10 Essential (primary) hypertension: Secondary | ICD-10-CM | POA: Insufficient documentation

## 2021-01-16 DIAGNOSIS — E119 Type 2 diabetes mellitus without complications: Secondary | ICD-10-CM | POA: Insufficient documentation

## 2021-01-16 DIAGNOSIS — D649 Anemia, unspecified: Secondary | ICD-10-CM

## 2021-01-16 DIAGNOSIS — R710 Precipitous drop in hematocrit: Secondary | ICD-10-CM | POA: Insufficient documentation

## 2021-01-16 DIAGNOSIS — R070 Pain in throat: Secondary | ICD-10-CM | POA: Diagnosis present

## 2021-01-16 DIAGNOSIS — Z794 Long term (current) use of insulin: Secondary | ICD-10-CM | POA: Insufficient documentation

## 2021-01-16 DIAGNOSIS — K115 Sialolithiasis: Secondary | ICD-10-CM | POA: Diagnosis not present

## 2021-01-16 DIAGNOSIS — R221 Localized swelling, mass and lump, neck: Secondary | ICD-10-CM | POA: Diagnosis not present

## 2021-01-16 HISTORY — DX: Type 2 diabetes mellitus without complications: E11.9

## 2021-01-16 LAB — BASIC METABOLIC PANEL
Anion gap: 10 (ref 5–15)
BUN: 41 mg/dL — ABNORMAL HIGH (ref 8–23)
CO2: 17 mmol/L — ABNORMAL LOW (ref 22–32)
Calcium: 8.8 mg/dL — ABNORMAL LOW (ref 8.9–10.3)
Chloride: 110 mmol/L (ref 98–111)
Creatinine, Ser: 3.13 mg/dL — ABNORMAL HIGH (ref 0.44–1.00)
GFR, Estimated: 16 mL/min — ABNORMAL LOW (ref >60)
Glucose, Bld: 301 mg/dL — ABNORMAL HIGH (ref 70–99)
Potassium: 4.4 mmol/L (ref 3.5–5.1)
Sodium: 137 mmol/L (ref 135–145)

## 2021-01-16 LAB — URINALYSIS, ROUTINE W REFLEX MICROSCOPIC
Bilirubin Urine: NEGATIVE
Glucose, UA: NEGATIVE mg/dL
Hgb urine dipstick: NEGATIVE
Ketones, ur: NEGATIVE mg/dL
Nitrite: NEGATIVE
Protein, ur: NEGATIVE mg/dL
Specific Gravity, Urine: 1.011 (ref 1.005–1.030)
pH: 5 (ref 5.0–8.0)

## 2021-01-16 LAB — CBC
HCT: 29.9 % — ABNORMAL LOW (ref 36.0–46.0)
Hemoglobin: 8.6 g/dL — ABNORMAL LOW (ref 12.0–15.0)
MCH: 21.4 pg — ABNORMAL LOW (ref 26.0–34.0)
MCHC: 28.8 g/dL — ABNORMAL LOW (ref 30.0–36.0)
MCV: 74.6 fL — ABNORMAL LOW (ref 80.0–100.0)
Platelets: 177 10*3/uL (ref 150–400)
RBC: 4.01 MIL/uL (ref 3.87–5.11)
RDW: 17.5 % — ABNORMAL HIGH (ref 11.5–15.5)
WBC: 6.3 10*3/uL (ref 4.0–10.5)
nRBC: 0 % (ref 0.0–0.2)

## 2021-01-16 LAB — CBG MONITORING, ED: Glucose-Capillary: 267 mg/dL — ABNORMAL HIGH (ref 70–99)

## 2021-01-16 LAB — GROUP A STREP BY PCR: Group A Strep by PCR: NOT DETECTED

## 2021-01-16 LAB — POC SARS CORONAVIRUS 2 AG -  ED: SARS Coronavirus 2 Ag: NEGATIVE

## 2021-01-16 MED ORDER — SODIUM CHLORIDE 0.9 % IV BOLUS
1000.0000 mL | Freq: Once | INTRAVENOUS | Status: AC
  Administered 2021-01-16: 1000 mL via INTRAVENOUS

## 2021-01-16 MED ORDER — OXYCODONE-ACETAMINOPHEN 5-325 MG PO TABS
1.0000 | ORAL_TABLET | Freq: Once | ORAL | Status: AC
  Administered 2021-01-16: 1 via ORAL
  Filled 2021-01-16: qty 1

## 2021-01-16 MED ORDER — TRAMADOL HCL 50 MG PO TABS: 50.0000 mg | ORAL_TABLET | Freq: Four times a day (QID) | ORAL | 0 refills | Status: AC | PRN

## 2021-01-16 MED ORDER — CLINDAMYCIN HCL 150 MG PO CAPS: 300.0000 mg | ORAL_CAPSULE | Freq: Three times a day (TID) | ORAL | 0 refills | Status: AC

## 2021-01-16 NOTE — ED Notes (Signed)
Patient is being discharged from the Urgent Care and sent to the Emergency Department via wheelchair . Per Apolonio Schneiders, [place] patient is in need of higher level of care due to mass on neck and bradycardia. Patient is aware and verbalizes understanding of plan of care.  Vitals:   01/16/21 1537  BP: (!) 160/71  Pulse: (!) 51  Resp: 16  Temp: 98.8 F (37.1 C)  SpO2: 100%

## 2021-01-16 NOTE — ED Notes (Signed)
Patient transported to CT 

## 2021-01-16 NOTE — ED Provider Notes (Signed)
[place] EMERGENCY DEPARTMENT Provider Note   CSN: [id number] Arrival date & time: 01/16/21  1643     History No chief complaint on file.   Tammy Wilcox is a 65 y.o. female with past medical history of hypertension, diabetes presents to the ED for evaluation of right-sided neck pain and swelling that she noticed 1 week ago.  This lump is tender.  Proximate size of a grape.  Has associated pain on the right side of her throat when she is passing her saliva and eating or drinking.  Reports having thyroid condition but does not know exactly the name of it.  States that usually her levels are "high" and her condition makes her lose weight.  However, states that she is on levothyroxine medicine.  States she is not really sure what her thyroid condition is however. she still has a thyroid gland. Reports chronic hot flashes and cold sweats at baseline.  Has not checked her fever.  States 2 weeks ago her daughter told her she probably had Covid because patient had no sense of taste or smell.  She did not get a formal Covid test however.  Has had 2 Covid vaccines but these were last year.  Denies nasal congestion, cough, chest pain, shortness of breath.  No nausea, vomiting, abdominal pain or diarrhea.  She took 2 antibiotic pills that she had leftover at home but states she does not know the name of them.  She went to urgent care for evaluation earlier today and they told her that her "kidneys were failing" and told her to come to the ER.  They also told her that her heart rate was low.  Reports having several dental extractions in the right lower jaw in December 2021.  States she has not had any dental issues since.  HPI     Past Medical History:  Diagnosis Date  . Diabetes mellitus without complication ([place])   . Hypertension     There are no problems to display for this patient.   Past Surgical History:  Procedure Laterality Date  . liver transplant       OB History    No obstetric history on file.     No family history on file.  Social History   Tobacco Use  . Smoking status: Unknown If Ever Smoked  . Smokeless tobacco: Never Used  Substance Use Topics  . Alcohol use: Never  . Drug use: Never    Home Medications Prior to Admission medications   Medication Sig Start Date End Date Taking? Authorizing Provider  clindamycin (CLEOCIN) 150 MG capsule Take 2 capsules (300 mg total) by mouth 3 (three) times daily for 7 days. 01/16/21 01/23/21 Yes Kinnie Feil, PA-C  traMADol (ULTRAM) 50 MG tablet Take 1 tablet (50 mg total) by mouth every 6 (six) hours as needed for up to 2 days for severe pain. 01/16/21 01/18/21 Yes Carmon Sails J, PA-C  amLODipine (NORVASC) 10 MG tablet Take 10 mg by mouth every evening.    [provider]  atorvastatin (LIPITOR) 10 MG tablet Take 10 mg by mouth daily.    [provider]  bimatoprost (LUMIGAN) 0.01 % SOLN Place 1 drop into both eyes at bedtime.    [provider]  cloNIDine (CATAPRES) 0.1 MG tablet Take 0.1 mg by mouth 2 (two) times daily.    [provider]  cycloSPORINE modified (NEORAL) 25 MG capsule Take 75 mg by mouth every 12 (twelve) hours.  [provider]  DULoxetine (CYMBALTA) 30 MG capsule Take 30 mg by mouth daily.    [provider]  furosemide (LASIX) 40 MG tablet Take 40 mg by mouth daily.    [provider]  gabapentin (NEURONTIN) 600 MG tablet Take 600 mg by mouth 3 (three) times daily. 09/02/17   [provider]  hydrALAZINE (APRESOLINE) 10 MG tablet Take 10 mg by mouth 3 (three) times daily.    [provider]  ibuprofen (ADVIL) 600 MG tablet Take 1 tablet (600 mg total) by mouth every 6 (six) hours as needed. 10/06/20   Domenic Moras, PA-C  insulin glargine (LANTUS) 100 UNIT/ML injection Inject 20 Units into the skin at bedtime.    [provider]  insulin lispro (HUMALOG KWIKPEN) 100 UNIT/ML KwikPen Inject  8 Units into the skin 3 (three) times daily with meals.    [provider]  levothyroxine (SYNTHROID, LEVOTHROID) 112 MCG tablet Take 112 mcg by mouth every morning.    [provider]  lidocaine (XYLOCAINE) 2 % solution Use as directed 15 mLs in the mouth or throat as needed for mouth pain. 10/06/20   Domenic Moras, PA-C  lisinopril (PRINIVIL,ZESTRIL) 10 MG tablet Take 10 mg by mouth daily.    [provider]  mycophenolate (MYFORTIC) 180 MG EC tablet Take 360 mg by mouth 2 (two) times daily.    [provider]  omeprazole (PRILOSEC) 20 MG capsule Take 20 mg by mouth daily.    [provider]  tiZANidine (ZANAFLEX) 4 MG tablet Take 4 mg by mouth every 6 (six) hours.    [provider]    Allergies    Patient has no known allergies.  Review of Systems   Review of Systems  HENT: Positive for sore throat.   Musculoskeletal: Positive for neck pain (nodule).  All other systems reviewed and are negative.   Physical Exam Updated Vital Signs BP (!) 178/60 (BP Location: Right Arm)   Pulse (!) 106   Temp 98.4 F (36.9 C) (Oral)   Resp 15   Ht '5\' 6"'$  (1.676 m)   Wt 95.3 kg   SpO2 100%   BMI 33.89 kg/m   Physical Exam Vitals and nursing note reviewed.  Constitutional:      General: She is not in acute distress.    Appearance: She is well-developed and well-nourished.     Comments: NAD.  HENT:     Head: Normocephalic and atraumatic.     Right Ear: External ear normal.     Left Ear: External ear normal.     Nose: Nose normal.     Mouth/Throat:     Comments: Oropharynx minimally erythematous. No exudates, lesions, trismus. Tolerating secretions. Mild tenderness in right sublingual space with edema and white firm stone in duct/gland, very small 1-2 mm.  Patient did not tolerate manipulation/expression. No pus or blood expressed.  All right lower dentition has been extracted. Poor dentition. Normal gingival and buccal mucosa Eyes:      General: No scleral icterus.    Extraocular Movements: EOM normal.     Conjunctiva/sclera: Conjunctivae normal.  Neck:     Comments: Tender, firm, nodule in right submandibular space approx size of a grape. Overlaying skin normal. Trachea midline. Thyroid non palpable. Full ROM of neck with some pain with neck bend,rotation (right). No neck or supraclavicular crepitus  Cardiovascular:     Rate and Rhythm: Normal rate and regular rhythm.     Heart sounds: Normal  heart sounds. No murmur heard.   Pulmonary:     Effort: Pulmonary effort is normal.     Breath sounds: Normal breath sounds. No wheezing.  Musculoskeletal:        General: No deformity. Normal range of motion.     Cervical back: Normal range of motion and neck supple.  Lymphadenopathy:     Cervical: Cervical adenopathy (?) present.  Skin:    General: Skin is warm and dry.     Capillary Refill: Capillary refill takes less than 2 seconds.  Neurological:     Mental Status: She is alert and oriented to person, place, and time.  Psychiatric:        Mood and Affect: Mood and affect normal.        Behavior: Behavior normal.        Thought Content: Thought content normal.        Judgment: Judgment normal.     ED Results / Procedures / Treatments   Labs (all labs ordered are listed, but only abnormal results are displayed) Labs Reviewed  BASIC METABOLIC PANEL - Abnormal; Notable for the following components:      Result Value   CO2 17 (*)    Glucose, Bld 301 (*)    BUN 41 (*)    Creatinine, Ser 3.13 (*)    Calcium 8.8 (*)    GFR, Estimated 16 (*)    All other components within normal limits  CBC - Abnormal; Notable for the following components:   Hemoglobin 8.6 (*)    HCT 29.9 (*)    MCV 74.6 (*)    MCH 21.4 (*)    MCHC 28.8 (*)    RDW 17.5 (*)    All other components within normal limits  CBG MONITORING, ED - Abnormal; Notable for the following components:   Glucose-Capillary 267 (*)    All other components within  normal limits  GROUP A STREP BY PCR  URINALYSIS, ROUTINE W REFLEX MICROSCOPIC  POC SARS CORONAVIRUS 2 AG -  ED    EKG None  Radiology CT SOFT TISSUE NECK WO CONTRAST  Result Date: 01/16/2021 CLINICAL DATA:  Submandibular facial swelling EXAM: CT NECK WITHOUT CONTRAST TECHNIQUE: Multidetector CT imaging of the neck was performed following the standard protocol without intravenous contrast. COMPARISON:  CT neck without contrast 10/06/2020 FINDINGS: PHARYNX AND LARYNX: Enlarged adenoid and palatine tonsils. Poor dentition. No focal oral cavity lesion. No retropharyngeal or peritonsillar fluid collection SALIVARY GLANDS: There is asymmetric enlargement of the right submandibular gland without surrounding inflammatory change. No ductal dilatation. The parotid glands are normal. THYROID: Unremarkable LYMPH NODES: No enlarged or abnormal density lymph nodes. VASCULAR: Atherosclerotic calcification of the carotid bifurcations. Calcific aortic atherosclerosis. LIMITED INTRACRANIAL: Normal. VISUALIZED ORBITS: Normal. MASTOIDS AND VISUALIZED PARANASAL SINUSES: No fluid levels or advanced mucosal thickening. No mastoid effusion. SKELETON: No bony spinal canal stenosis. No lytic or blastic lesions. UPPER CHEST: Clear. OTHER: None. IMPRESSION: 1. Asymmetric enlargement of the right submandibular gland without a clear cause. This may be reactive and related to prior dental infection. 2. Unchanged mild enlargement of the adenoid and palatine tonsils. Aortic Atherosclerosis (ICD10-I70.0). Electronically Signed   By: Ulyses Jarred M.D.   On: 01/16/2021 20:19    Procedures Procedures   Medications Ordered in ED Medications  oxyCODONE-acetaminophen (PERCOCET/ROXICET) 5-325 MG per tablet 1 tablet (has no administration in time range)  sodium chloride 0.9 % bolus 1,000 mL (1,000 mLs Intravenous New Bag/Given 01/16/21 2053)    ED Course  I have reviewed the triage vital signs and the nursing notes.  Pertinent  labs & imaging results that were available during my care of the patient were reviewed by me and considered in my medical decision making (see chart for details).  Clinical Course as of 01/16/21 2145  Thu Jan 16, 2021  1849 Creatinine(!): 3.13 Previous creatinine 2.8-2.9 one month ago  [CG]  1850 GFR, Estimated(!): 16 [CG]  1850 Glucose(!): 301 [CG]  2054 Pulse Rate(!): 46 [CG]  2054 Pulse Rate(!): 106 [CG]    Clinical Course User Index [CG] Kinnie Feil, PA-C   MDM Rules/Calculators/A&P                           65 y.o. year old female presents with right-sided sublingual pain, submandibular swelling and sore throat for the last 1 week.  Reports 2 weeks ago having a loss of taste and smell and could have had Covid but did not get a formal test.  Reports decrease in oral fluid and food intake due to the pain with eating and swallowing.  No fever.  EMR, triage and nursing notes reviewed  Lab work and imaging ordered as above in triage.  I have added additional work-up including group a strep and Covid swab, CT of the neck.   Personally visualized and interpreted above labs and imaging   Imaging reveals -CT of the neck without contrast shows right submandibular gland edema otherwise normal.  No abscess.  Labs reveal -WBC normal.  Chronic anemia hemoglobin 8.6 today, her baseline appears to be around 9-9.5.  Creatinine today is 3.13, her baseline in the last few months has been around 2.8-2.9.  Strep negative.  Covid negative.  Medicines ordered - 1 L IVF, percocet.    Clinical presentation is most consistent with salivary gland stone right submandibular gland.  There is a palpable small stone on exam but patient cannot tolerate any manipulation/expression of this.    In regards to hemoglobin and creatine - patient tells me she has history of liver cirrhosis s/p liver transplant. She is also a kidney patient and is on kidney transplant list. Her care is in [place], she is  relocating here.  Today she has mild elevation from baseline, I suspect this is from decreased PO fluid/food intake.  Had intermittent light headedness when standing however orthostatics here negative. Patient ambulated with normal HR and SpO2 and completely asymptomatic. Her hemoglobin is lower than baseline as well today. She denies symptoms of active bleeding like hematuria, melena, GI bleed. She has no Cp, SOB, palpitations or syncope. Positional light headedness likely related to dehydration.  Anemia may be from CKD.  Repeat vitals reassuring.  Will defer further emergent interventions at this time.    Will discharge with pain medicine, antibiotic, warm compresses, sour candy and close PCP follow up regarding hemoglobin and creatinine for re-check this week.  I will message CM/SW to help patient arrange PCP follow up here as she is trying to relocate. Strict return precautions given. Patient in agreement with plan.   Final Clinical Impression(s) / ED Diagnoses Final diagnoses:  Salivary gland stone  Elevated serum creatinine  Low hemoglobin    Rx / DC Orders ED Discharge Orders         Ordered    clindamycin (CLEOCIN) 150 MG capsule  3 times daily        01/16/21 2143    traMADol (ULTRAM) 50 MG tablet  Every  6 hours PRN        01/16/21 2143           Arlean Hopping 01/16/21 2145    Margette Fast, MD 01/17/21 [phone]

## 2021-01-16 NOTE — ED Notes (Signed)
Patient verbalized understanding of discharge instructions. Opportunity for questions and answers.  

## 2021-01-16 NOTE — Discharge Instructions (Addendum)
You were seen in the emergency department for pain under your tongue and right-sided neck swelling  You have a stone in your salivary gland that is causing pain and swelling.  Take 650 to 1000 mg of acetaminophen every 6 hours for mild to moderate pain.  Take 50 mg of tramadol, 6 hours for more severe or breakthrough pain.  Do not take ibuprofen or aspirin containing products given your history of kidney disease  Massage the area underneath your tongue to help the stone come out.  Use hot/warm water to massage and do warm compresses under the tongue and on the outside of the neck  Try to chew or suck on some sour candy this will help produce more saliva and can help get the stone out  Many times salivary gland stones do not need antibiotics however we will prescribed clindamycin that you can take every 8 hours for the next 7 days to prevent secondary infection or abscess  Please follow-up with your primary care doctor in the next 72 hours for reevaluation especially if your symptoms have not improved  Return to the ED for worsening or more severe symptoms, fever, difficulty swallowing or unable to keep anything down due to the pain  Your hemoglobin is 8.6 today.  Compared to previous tests it is slightly lower than usual.  Your kidney function, creatinine is also abnormal and slightly more elevated than your baseline.  Your creatinine today was 3.2.  I think this is related to dehydration.  You were given IV fluids.  Please call your primary care doctor and make an appointment to have your hemoglobin and creatinine rechecked in the next week  I have messaged our case management/social worker to help you get an appointment with a primary care doctor locally

## 2021-01-16 NOTE — ED Notes (Signed)
Stands on own ability for [facility]. No complaints or concerns.

## 2021-01-16 NOTE — ED Triage Notes (Signed)
Pt sent to ED from [facility] for further eval of mass on her neck x 1 week that she sts keeps her from eating. UC also notes bradycardia, but per patient this is her normal d/t a thyroid condition. Reports dizziness when she stands x 1 week.

## 2021-01-16 NOTE — ED Triage Notes (Signed)
Pt in with c/o feeling light headed and not being able to eat due to nodule that she feels on the right side of her neck.  States that every time she eats she feels like her heart is racing and she gets dizzy and has to lay down. Also states she feels off balance  Denies in sob or LOC

## 2021-01-18 NOTE — ED Provider Notes (Signed)
[facility]    CSN: [id number] Arrival date & time: 01/16/21  1528      History   Chief Complaint Chief Complaint  Patient presents with  . nodule on neck  . Dizziness    HPI Tammy Wilcox is a 65 y.o. female.   Here today with about a week of painful swelling to right side of neck/lower jaw that is keeping her from eating or drinking much since onset as movement makes the pain worse. She states if she gets up suddenly or tries to eat anything she feels very dizzy and has palpitations and has to lay down for a while until this subsides. No fever, headache, CP, SOB, drainage to the area at this time. Hx of hypothyroidism and per record review has had a large dental abscess requiring intervention in the recent past. Also has a hx of poorly controlled diabetes, HTN, and multiple other chronic conditions. Not trying anything OTC for sxs. Went to ED 2 days ago but left without being seen. Per record review her hgb was 8.7 and her serum creatinine was 2.8 at this time and she has had very poor oral intake since then. Feels very weak.      Past Medical History:  Diagnosis Date  . Diabetes mellitus without complication ([place])   . Hypertension     There are no problems to display for this patient.   Past Surgical History:  Procedure Laterality Date  . liver transplant      OB History   No obstetric history on file.      Home Medications    Prior to Admission medications   Medication Sig Start Date End Date Taking? Authorizing Provider  amLODipine (NORVASC) 10 MG tablet Take 10 mg by mouth every evening.    [provider]  atorvastatin (LIPITOR) 10 MG tablet Take 10 mg by mouth daily.    [provider]  bimatoprost (LUMIGAN) 0.01 % SOLN Place 1 drop into both eyes at bedtime.    [provider]  clindamycin (CLEOCIN) 150 MG capsule Take 2 capsules (300 mg total) by mouth 3 (three) times daily for 7 days. 01/16/21 01/23/21  Kinnie Feil, PA-C  cloNIDine (CATAPRES) 0.1 MG tablet Take 0.1 mg by mouth 2 (two) times daily.    [provider]  cycloSPORINE modified (NEORAL) 25 MG capsule Take 75 mg by mouth every 12 (twelve) hours.    [provider]  DULoxetine (CYMBALTA) 30 MG capsule Take 30 mg by mouth daily.    [provider]  furosemide (LASIX) 40 MG tablet Take 40 mg by mouth daily.    [provider]  gabapentin (NEURONTIN) 600 MG tablet Take 600 mg by mouth 3 (three) times daily. 09/02/17   [provider]  hydrALAZINE (APRESOLINE) 10 MG tablet Take 10 mg by mouth 3 (three) times daily.    [provider]  ibuprofen (ADVIL) 600 MG tablet Take 1 tablet (600 mg total) by mouth every 6 (six) hours as needed. 10/06/20   Domenic Moras, PA-C  insulin glargine (LANTUS) 100 UNIT/ML injection Inject 20 Units into the skin at bedtime.    [provider]  insulin lispro (HUMALOG KWIKPEN) 100 UNIT/ML KwikPen Inject 8 Units into the skin 3 (three) times daily with meals.    [provider]  levothyroxine (SYNTHROID, LEVOTHROID) 112 MCG tablet Take 112 mcg by mouth every morning.    [provider]  lidocaine (XYLOCAINE) 2 % solution Use as  directed 15 mLs in the mouth or throat as needed for mouth pain. 10/06/20   Domenic Moras, PA-C  lisinopril (PRINIVIL,ZESTRIL) 10 MG tablet Take 10 mg by mouth daily.    [provider]  mycophenolate (MYFORTIC) 180 MG EC tablet Take 360 mg by mouth 2 (two) times daily.    [provider]  omeprazole (PRILOSEC) 20 MG capsule Take 20 mg by mouth daily.    [provider]  tiZANidine (ZANAFLEX) 4 MG tablet Take 4 mg by mouth every 6 (six) hours.    [provider]  traMADol (ULTRAM) 50 MG tablet Take 1 tablet (50 mg total) by mouth every 6 (six) hours as needed for up to 2 days for severe pain. 01/16/21 01/18/21  Kinnie Feil, PA-C    Family History History reviewed. No pertinent  family history.  Social History Social History   Tobacco Use  . Smoking status: Unknown If Ever Smoked  . Smokeless tobacco: Never Used  Substance Use Topics  . Alcohol use: Never  . Drug use: Never     Allergies   Patient has no known allergies.   Review of Systems Review of Systems PER HPI   Physical Exam Triage Vital Signs ED Triage Vitals  Enc Vitals Group     BP 01/16/21 1537 (!) 160/71     Pulse Rate 01/16/21 1537 (!) 51     Resp 01/16/21 1537 16     Temp 01/16/21 1537 98.8 F (37.1 C)     Temp src --      SpO2 01/16/21 1537 100 %     Weight --      Height --      Head Circumference --      Peak Flow --      Pain Score 01/16/21 1540 9     Pain Loc --      Pain Edu? --      Excl. in [place]? --    No data found.  Updated Vital Signs BP (!) 160/71   Pulse (!) 51   Temp 98.8 F (37.1 C)   Resp 16   SpO2 100%   Visual Acuity Right Eye Distance:   Left Eye Distance:   Bilateral Distance:    Right Eye Near:   Left Eye Near:    Bilateral Near:     Physical Exam Vitals and nursing note reviewed.  Constitutional:      Appearance: Normal appearance.     Comments: Appears mildly lethargic  HENT:     Head: Atraumatic.     Right Ear: Tympanic membrane normal.     Left Ear: Tympanic membrane normal.     Nose: Nose normal.     Mouth/Throat:     Mouth: Mucous membranes are moist.     Pharynx: No oropharyngeal exudate or posterior oropharyngeal erythema.     Comments: No evidence of dental infection Eyes:     Extraocular Movements: Extraocular movements intact.     Conjunctiva/sclera: Conjunctivae normal.  Neck:     Comments: 1- 1.5 cm firm tender mass at base of right jaw extending to upper portion of neck. Not fluctuant, no erythema, no drainage noted Cardiovascular:     Rate and Rhythm: Bradycardia present.     Heart sounds: Normal heart sounds.  Pulmonary:     Effort: Pulmonary effort is normal.     Breath sounds: Normal breath sounds.   Musculoskeletal:        General: Normal range of  motion.     Cervical back: Normal range of motion.  Skin:    General: Skin is warm and dry.  Neurological:     Mental Status: She is alert and oriented to person, place, and time.  Psychiatric:        Mood and Affect: Mood normal.        Thought Content: Thought content normal.        Judgment: Judgment normal.      UC Treatments / Results  Labs (all labs ordered are listed, but only abnormal results are displayed) Labs Reviewed - No data to display  EKG   Radiology CT SOFT TISSUE NECK WO CONTRAST  Result Date: 01/16/2021 CLINICAL DATA:  Submandibular facial swelling EXAM: CT NECK WITHOUT CONTRAST TECHNIQUE: Multidetector CT imaging of the neck was performed following the standard protocol without intravenous contrast. COMPARISON:  CT neck without contrast 10/06/2020 FINDINGS: PHARYNX AND LARYNX: Enlarged adenoid and palatine tonsils. Poor dentition. No focal oral cavity lesion. No retropharyngeal or peritonsillar fluid collection SALIVARY GLANDS: There is asymmetric enlargement of the right submandibular gland without surrounding inflammatory change. No ductal dilatation. The parotid glands are normal. THYROID: Unremarkable LYMPH NODES: No enlarged or abnormal density lymph nodes. VASCULAR: Atherosclerotic calcification of the carotid bifurcations. Calcific aortic atherosclerosis. LIMITED INTRACRANIAL: Normal. VISUALIZED ORBITS: Normal. MASTOIDS AND VISUALIZED PARANASAL SINUSES: No fluid levels or advanced mucosal thickening. No mastoid effusion. SKELETON: No bony spinal canal stenosis. No lytic or blastic lesions. UPPER CHEST: Clear. OTHER: None. IMPRESSION: 1. Asymmetric enlargement of the right submandibular gland without a clear cause. This may be reactive and related to prior dental infection. 2. Unchanged mild enlargement of the adenoid and palatine tonsils. Aortic Atherosclerosis (ICD10-I70.0). Electronically Signed   By: Ulyses Jarred M.D.   On: 01/16/2021 20:19    Procedures Procedures (including critical care time)  Medications Ordered in UC Medications - No data to display  Initial Impression / Assessment and Plan / UC Course  I have reviewed the triage vital signs and the nursing notes.  Pertinent labs & imaging results that were available during my care of the patient were reviewed by me and considered in my medical decision making (see chart for details).     Patient very weak, not eating well due to the mass and discomfort, and already per labs 2 days ago had poor renal function and significant anemia noted on labs and now having severe dizziness and weakness. Bradycardic and hypertensive in triage. Discussed likely salivary stone given worsening with eating and location but cannot r/o other causes without imaging of the area and that she could benefit from stat labs, IV hydration, and other evaluation given the complexity of her case. She is agreeable to nursing transport to the ED for further evaluation and management at this time. Report called to [facility] ED. Hemodynamically stable for transport by nursing staff.   Final Clinical Impressions(s) / UC Diagnoses   Final diagnoses:  Dizziness  Mass of neck  Bradycardia   Discharge Instructions   None    ED Prescriptions    None     PDMP not reviewed this encounter.   Volney American, [place] 01/18/21 1145

## 2021-12-23 ENCOUNTER — Other Ambulatory Visit: Payer: Self-pay

## 2021-12-23 ENCOUNTER — Emergency Department (HOSPITAL_COMMUNITY)
Admission: EM | Admit: 2021-12-23 | Discharge: 2021-12-23 | Disposition: A | Discharge status: Home / Self Care | Payer: 59 | Attending: Emergency Medicine | Admitting: Emergency Medicine

## 2021-12-23 ENCOUNTER — Emergency Department (HOSPITAL_COMMUNITY): Payer: 59

## 2021-12-23 ENCOUNTER — Encounter (HOSPITAL_COMMUNITY): Payer: Self-pay | Admitting: Emergency Medicine

## 2021-12-23 DIAGNOSIS — N186 End stage renal disease: Secondary | ICD-10-CM | POA: Insufficient documentation

## 2021-12-23 DIAGNOSIS — Z20822 Contact with and (suspected) exposure to covid-19: Secondary | ICD-10-CM | POA: Diagnosis not present

## 2021-12-23 DIAGNOSIS — R0981 Nasal congestion: Secondary | ICD-10-CM | POA: Diagnosis not present

## 2021-12-23 DIAGNOSIS — Z5321 Procedure and treatment not carried out due to patient leaving prior to being seen by health care provider: Secondary | ICD-10-CM | POA: Insufficient documentation

## 2021-12-23 DIAGNOSIS — R519 Headache, unspecified: Secondary | ICD-10-CM | POA: Diagnosis not present

## 2021-12-23 DIAGNOSIS — R739 Hyperglycemia, unspecified: Secondary | ICD-10-CM | POA: Diagnosis not present

## 2021-12-23 DIAGNOSIS — R052 Subacute cough: Secondary | ICD-10-CM | POA: Insufficient documentation

## 2021-12-23 DIAGNOSIS — R059 Cough, unspecified: Secondary | ICD-10-CM | POA: Insufficient documentation

## 2021-12-23 LAB — RESP PANEL BY RT-PCR (FLU A&B, COVID) ARPGX2
Influenza A by PCR: NEGATIVE
Influenza B by PCR: NEGATIVE
SARS Coronavirus 2 by RT PCR: NEGATIVE

## 2021-12-23 LAB — CBC WITH DIFFERENTIAL/PLATELET
Abs Immature Granulocytes: 0.07 10*3/uL (ref 0.00–0.07)
Basophils Absolute: 0 10*3/uL (ref 0.0–0.1)
Basophils Relative: 1 %
Eosinophils Absolute: 0.1 10*3/uL (ref 0.0–0.5)
Eosinophils Relative: 2 %
HCT: 29.8 % — ABNORMAL LOW (ref 36.0–46.0)
Hemoglobin: 8.9 g/dL — ABNORMAL LOW (ref 12.0–15.0)
Immature Granulocytes: 1 %
Lymphocytes Relative: 22 %
Lymphs Abs: 1.4 10*3/uL (ref 0.7–4.0)
MCH: 22.1 pg — ABNORMAL LOW (ref 26.0–34.0)
MCHC: 29.9 g/dL — ABNORMAL LOW (ref 30.0–36.0)
MCV: 73.9 fL — ABNORMAL LOW (ref 80.0–100.0)
Monocytes Absolute: 0.5 10*3/uL (ref 0.1–1.0)
Monocytes Relative: 8 %
Neutro Abs: 4.4 10*3/uL (ref 1.7–7.7)
Neutrophils Relative %: 66 %
Platelets: 192 10*3/uL (ref 150–400)
RBC: 4.03 MIL/uL (ref 3.87–5.11)
RDW: 16.9 % — ABNORMAL HIGH (ref 11.5–15.5)
WBC: 6.6 10*3/uL (ref 4.0–10.5)
nRBC: 0 % (ref 0.0–0.2)

## 2021-12-23 LAB — COMPREHENSIVE METABOLIC PANEL
ALT: 12 U/L (ref 0–44)
AST: 20 U/L (ref 15–41)
Albumin: 3.2 g/dL — ABNORMAL LOW (ref 3.5–5.0)
Alkaline Phosphatase: 80 U/L (ref 38–126)
Anion gap: 10 (ref 5–15)
BUN: 27 mg/dL — ABNORMAL HIGH (ref 8–23)
CO2: 21 mmol/L — ABNORMAL LOW (ref 22–32)
Calcium: 8 mg/dL — ABNORMAL LOW (ref 8.9–10.3)
Chloride: 107 mmol/L (ref 98–111)
Creatinine, Ser: 2.63 mg/dL — ABNORMAL HIGH (ref 0.44–1.00)
GFR, Estimated: 19 mL/min — ABNORMAL LOW (ref >60)
Glucose, Bld: 226 mg/dL — ABNORMAL HIGH (ref 70–99)
Potassium: 4.2 mmol/L (ref 3.5–5.1)
Sodium: 138 mmol/L (ref 135–145)
Total Bilirubin: 0.6 mg/dL (ref 0.3–1.2)
Total Protein: 6.8 g/dL (ref 6.5–8.1)

## 2021-12-23 LAB — CBG MONITORING, ED: Glucose-Capillary: 255 mg/dL — ABNORMAL HIGH (ref 70–99)

## 2021-12-23 MED ORDER — ALBUTEROL SULFATE HFA 108 (90 BASE) MCG/ACT IN AERS
2.0000 | INHALATION_SPRAY | Freq: Once | RESPIRATORY_TRACT | Status: AC
  Administered 2021-12-23: 2 via RESPIRATORY_TRACT
  Filled 2021-12-23: qty 6.7

## 2021-12-23 MED ORDER — AEROCHAMBER PLUS FLO-VU LARGE MISC: 1.0000 | Freq: Once | Status: DC

## 2021-12-23 MED ORDER — AEROCHAMBER PLUS FLO-VU LARGE MISC
Status: AC
  Filled 2021-12-23: qty 1

## 2021-12-23 NOTE — ED Notes (Signed)
Pt was called for vitals. Pt did not answer.

## 2021-12-23 NOTE — ED Provider Notes (Signed)
Emergency Medicine Provider Triage Evaluation Note  Tammy Wilcox , a 66 y.o. female  was evaluated in triage.  Pt complains of 2 weeks of cough.  When this started she states she felt like she had a cold with associated nasal congestion, headache and some body pain.  She did not get tested for flu or COVID. 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  She reports compliance with all of her medications including her immunosuppressants.  She states that she has productive cough.  Review of Systems  Positive: See above Negative:   Physical Exam  BP (!) 163/70    Pulse 62    Temp 98.2 F (36.8 C)    Resp 20    Ht 5\' 6"  (1.676 m)    Wt (!) 151 kg    SpO2 98%    BMI 53.73 kg/m  Gen:   Awake, no distress   Resp:  Normal effort, with near constant nonproductive cough.  Lung sounds present bilaterally, faint wheezes in upper fields MSK:   Moves extremities without difficulty  Other:  Patient is awake and alert.  Medical Decision Making  Medically screening exam initiated at 1:24 AM.  Appropriate orders placed.  Tammy Wilcox was informed that the remainder of the evaluation will be completed by another provider, this initial triage assessment does not replace that evaluation, and the importance of remaining in the ED until their evaluation is complete.  66 year old woman with history of liver transplant, currently listed for kidney transplant immunosuppressed who presents today for frequent cough.  While I am in the room she has a near constant cough.  Trace wheezes.  She has used an inhaler in the past, will trial with 2 puffs of albuterol.  Symptoms have been ongoing for 2 weeks.  Will order chest x-ray, and based on her immunosuppressed status will order labs additionally.  Note: Portions of this report may have been transcribed using voice recognition software. Every effort was made to ensure accuracy;  however, inadvertent computerized transcription errors may be present    Tammy Glass, PA-C 12/23/21 0127    Tammy Hacker, MD 12/23/21 [phone]

## 2021-12-23 NOTE — ED Triage Notes (Signed)
Patient reports persistent chest congestion and productive cough for several days , denies fever or chills .

## 2021-12-23 NOTE — ED Notes (Signed)
Patient left after having her V/S taken.

## 2022-01-10 ENCOUNTER — Emergency Department (HOSPITAL_COMMUNITY): Payer: 59

## 2022-01-10 ENCOUNTER — Emergency Department (HOSPITAL_COMMUNITY)
Admission: EM | Admit: 2022-01-10 | Discharge: 2022-01-10 | Disposition: A | Discharge status: Home / Self Care | Payer: 59 | Attending: Emergency Medicine | Admitting: Emergency Medicine

## 2022-01-10 ENCOUNTER — Encounter (HOSPITAL_COMMUNITY): Payer: Self-pay

## 2022-01-10 ENCOUNTER — Other Ambulatory Visit: Payer: Self-pay

## 2022-01-10 DIAGNOSIS — N189 Chronic kidney disease, unspecified: Secondary | ICD-10-CM | POA: Diagnosis not present

## 2022-01-10 DIAGNOSIS — R051 Acute cough: Secondary | ICD-10-CM | POA: Diagnosis not present

## 2022-01-10 DIAGNOSIS — Z794 Long term (current) use of insulin: Secondary | ICD-10-CM | POA: Diagnosis not present

## 2022-01-10 DIAGNOSIS — Z20822 Contact with and (suspected) exposure to covid-19: Secondary | ICD-10-CM | POA: Diagnosis not present

## 2022-01-10 DIAGNOSIS — R531 Weakness: Secondary | ICD-10-CM | POA: Diagnosis not present

## 2022-01-10 DIAGNOSIS — R112 Nausea with vomiting, unspecified: Secondary | ICD-10-CM | POA: Insufficient documentation

## 2022-01-10 DIAGNOSIS — E119 Type 2 diabetes mellitus without complications: Secondary | ICD-10-CM | POA: Insufficient documentation

## 2022-01-10 DIAGNOSIS — R109 Unspecified abdominal pain: Secondary | ICD-10-CM | POA: Diagnosis present

## 2022-01-10 DIAGNOSIS — R6883 Chills (without fever): Secondary | ICD-10-CM | POA: Diagnosis not present

## 2022-01-10 DIAGNOSIS — R197 Diarrhea, unspecified: Secondary | ICD-10-CM | POA: Insufficient documentation

## 2022-01-10 DIAGNOSIS — R5383 Other fatigue: Secondary | ICD-10-CM | POA: Diagnosis not present

## 2022-01-10 DIAGNOSIS — R0602 Shortness of breath: Secondary | ICD-10-CM | POA: Insufficient documentation

## 2022-01-10 DIAGNOSIS — R1084 Generalized abdominal pain: Secondary | ICD-10-CM | POA: Diagnosis not present

## 2022-01-10 LAB — COMPREHENSIVE METABOLIC PANEL
ALT: 14 U/L (ref 0–44)
AST: 15 U/L (ref 15–41)
Albumin: 3.6 g/dL (ref 3.5–5.0)
Alkaline Phosphatase: 81 U/L (ref 38–126)
Anion gap: 7 (ref 5–15)
BUN: 33 mg/dL — ABNORMAL HIGH (ref 8–23)
CO2: 21 mmol/L — ABNORMAL LOW (ref 22–32)
Calcium: 8.7 mg/dL — ABNORMAL LOW (ref 8.9–10.3)
Chloride: 108 mmol/L (ref 98–111)
Creatinine, Ser: 2.51 mg/dL — ABNORMAL HIGH (ref 0.44–1.00)
GFR, Estimated: 20 mL/min — ABNORMAL LOW (ref >60)
Glucose, Bld: 280 mg/dL — ABNORMAL HIGH (ref 70–99)
Potassium: 4.6 mmol/L (ref 3.5–5.1)
Sodium: 136 mmol/L (ref 135–145)
Total Bilirubin: 0.6 mg/dL (ref 0.3–1.2)
Total Protein: 7.4 g/dL (ref 6.5–8.1)

## 2022-01-10 LAB — CBC WITH DIFFERENTIAL/PLATELET
Abs Immature Granulocytes: 0.06 10*3/uL (ref 0.00–0.07)
Basophils Absolute: 0 10*3/uL (ref 0.0–0.1)
Basophils Relative: 0 %
Eosinophils Absolute: 0.1 10*3/uL (ref 0.0–0.5)
Eosinophils Relative: 1 %
HCT: 34.8 % — ABNORMAL LOW (ref 36.0–46.0)
Hemoglobin: 10.1 g/dL — ABNORMAL LOW (ref 12.0–15.0)
Immature Granulocytes: 1 %
Lymphocytes Relative: 3 %
Lymphs Abs: 0.3 10*3/uL — ABNORMAL LOW (ref 0.7–4.0)
MCH: 21.9 pg — ABNORMAL LOW (ref 26.0–34.0)
MCHC: 29 g/dL — ABNORMAL LOW (ref 30.0–36.0)
MCV: 75.5 fL — ABNORMAL LOW (ref 80.0–100.0)
Monocytes Absolute: 0.7 10*3/uL (ref 0.1–1.0)
Monocytes Relative: 6 %
Neutro Abs: 11.6 10*3/uL — ABNORMAL HIGH (ref 1.7–7.7)
Neutrophils Relative %: 89 %
Platelets: 204 10*3/uL (ref 150–400)
RBC: 4.61 MIL/uL (ref 3.87–5.11)
RDW: 17.3 % — ABNORMAL HIGH (ref 11.5–15.5)
WBC: 12.8 10*3/uL — ABNORMAL HIGH (ref 4.0–10.5)
nRBC: 0 % (ref 0.0–0.2)

## 2022-01-10 LAB — URINALYSIS, ROUTINE W REFLEX MICROSCOPIC
Bilirubin Urine: NEGATIVE
Glucose, UA: NEGATIVE mg/dL
Ketones, ur: NEGATIVE mg/dL
Leukocytes,Ua: NEGATIVE
Nitrite: NEGATIVE
Protein, ur: 100 mg/dL — AB
Specific Gravity, Urine: 1.015 (ref 1.005–1.030)
pH: 5 (ref 5.0–8.0)

## 2022-01-10 LAB — MAGNESIUM: Magnesium: 1.5 mg/dL — ABNORMAL LOW (ref 1.7–2.4)

## 2022-01-10 LAB — RESP PANEL BY RT-PCR (FLU A&B, COVID) ARPGX2
Influenza A by PCR: NEGATIVE
Influenza B by PCR: NEGATIVE
SARS Coronavirus 2 by RT PCR: NEGATIVE

## 2022-01-10 LAB — LIPASE, BLOOD: Lipase: 23 U/L (ref 11–51)

## 2022-01-10 MED ORDER — LACTATED RINGERS IV BOLUS
1000.0000 mL | Freq: Once | INTRAVENOUS | Status: AC
  Administered 2022-01-10: 1000 mL via INTRAVENOUS

## 2022-01-10 MED ORDER — METOCLOPRAMIDE HCL 5 MG/ML IJ SOLN
10.0000 mg | Freq: Once | INTRAMUSCULAR | Status: AC
  Administered 2022-01-10: 10 mg via INTRAVENOUS
  Filled 2022-01-10: qty 2

## 2022-01-10 MED ORDER — ONDANSETRON 4 MG PO TBDP: 4.0000 mg | ORAL_TABLET | Freq: Three times a day (TID) | ORAL | 0 refills | Status: DC | PRN

## 2022-01-10 MED ORDER — MAGNESIUM OXIDE -MG SUPPLEMENT 400 (240 MG) MG PO TABS
800.0000 mg | ORAL_TABLET | Freq: Once | ORAL | Status: AC
  Administered 2022-01-10: 800 mg via ORAL
  Filled 2022-01-10: qty 2

## 2022-01-10 MED ORDER — SODIUM CHLORIDE 0.9 % IV SOLN
2.0000 g | Freq: Once | INTRAVENOUS | Status: AC
  Administered 2022-01-10: 2 g via INTRAVENOUS
  Filled 2022-01-10: qty 20

## 2022-01-10 MED ORDER — AZITHROMYCIN 250 MG PO TABS: 250.0000 mg | ORAL_TABLET | Freq: Every day | ORAL | 0 refills | Status: DC

## 2022-01-10 MED ORDER — CEFDINIR 300 MG PO CAPS: 300.0000 mg | ORAL_CAPSULE | Freq: Every day | ORAL | 0 refills | Status: AC

## 2022-01-10 NOTE — ED Provider Notes (Signed)
[facility] DEPT Provider Note   CSN: [id number] Arrival date & time: 01/10/22  0827     History  Chief Complaint  Patient presents with   Abdominal Pain   Shortness of Breath    Tammy Wilcox is a 66 y.o. female.   Abdominal Pain Associated symptoms: chills, cough, diarrhea, fatigue, nausea, shortness of breath and vomiting   Associated symptoms: no chest pain, no dysuria, no fever and no sore throat   Shortness of Breath Associated symptoms: abdominal pain, cough and vomiting   Associated symptoms: no chest pain, no fever, no headaches, no neck pain, no sore throat and no wheezing   Patient presenting for cough, nausea, vomiting, diarrhea, chills, shortness of breath, and generalized weakness.  Onset of symptoms was this morning.  Last night, she felt to be in her normal state of health.  She lives at home with family and she is not aware of any sick contacts that she has encountered lately.  Patient medical history is notable for liver transplant in 2013.  She has been adherent to home medications.  EMS reported blood sugar of 385.  She was experiencing nausea and vomiting and was given 4 mg of Zofran prior to arrival.  Patient endorses continued nausea.    Home Medications Prior to Admission medications   Medication Sig Start Date End Date Taking? Authorizing Provider  azithromycin (ZITHROMAX Z-PAK) 250 MG tablet Take 1 tablet (250 mg total) by mouth daily. Take 2 tablets on day 1 01/10/22  Yes Godfrey Pick, MD  cefdinir (OMNICEF) 300 MG capsule Take 1 capsule (300 mg total) by mouth daily for 5 days. 01/10/22 01/15/22 Yes Godfrey Pick, MD  ondansetron (ZOFRAN-ODT) 4 MG disintegrating tablet Take 1 tablet (4 mg total) by mouth every 8 (eight) hours as needed for nausea or vomiting. 01/10/22  Yes Godfrey Pick, MD  amLODipine (NORVASC) 10 MG tablet Take 10 mg by mouth every evening.    [provider]  atorvastatin (LIPITOR) 10 MG tablet Take 10 mg  by mouth daily.    [provider]  bimatoprost (LUMIGAN) 0.01 % SOLN Place 1 drop into both eyes at bedtime.    [provider]  cloNIDine (CATAPRES) 0.1 MG tablet Take 0.1 mg by mouth 2 (two) times daily.    [provider]  cycloSPORINE modified (NEORAL) 25 MG capsule Take 75 mg by mouth every 12 (twelve) hours.    [provider]  DULoxetine (CYMBALTA) 30 MG capsule Take 30 mg by mouth daily.    [provider]  furosemide (LASIX) 40 MG tablet Take 40 mg by mouth daily.    [provider]  gabapentin (NEURONTIN) 600 MG tablet Take 600 mg by mouth 3 (three) times daily. 09/02/17   [provider]  hydrALAZINE (APRESOLINE) 10 MG tablet Take 10 mg by mouth 3 (three) times daily.    [provider]  ibuprofen (ADVIL) 600 MG tablet Take 1 tablet (600 mg total) by mouth every 6 (six) hours as needed. 10/06/20   Domenic Moras, PA-C  insulin glargine (LANTUS) 100 UNIT/ML injection Inject 20 Units into the skin at bedtime.    [provider]  insulin lispro (HUMALOG KWIKPEN) 100 UNIT/ML KwikPen Inject 8 Units into the skin 3 (three) times daily with meals.    [provider]  levothyroxine (SYNTHROID, LEVOTHROID) 112 MCG tablet Take 112 mcg by mouth every morning.    [provider]  lidocaine (XYLOCAINE) 2 % solution Use as  directed 15 mLs in the mouth or throat as needed for mouth pain. 10/06/20   Domenic Moras, PA-C  lisinopril (PRINIVIL,ZESTRIL) 10 MG tablet Take 10 mg by mouth daily.    [provider]  mycophenolate (MYFORTIC) 180 MG EC tablet Take 360 mg by mouth 2 (two) times daily.    [provider]  omeprazole (PRILOSEC) 20 MG capsule Take 20 mg by mouth daily.    [provider]  tiZANidine (ZANAFLEX) 4 MG tablet Take 4 mg by mouth every 6 (six) hours.    [provider]      Allergies    Patient has no known allergies.    Review of Systems   Review of  Systems  Constitutional:  Positive for chills and fatigue. Negative for fever.  HENT:  Negative for congestion, facial swelling and sore throat.   Respiratory:  Positive for cough and shortness of breath. Negative for wheezing and stridor.   Cardiovascular:  Negative for chest pain, palpitations and leg swelling.  Gastrointestinal:  Positive for abdominal pain, diarrhea, nausea and vomiting.  Genitourinary:  Negative for dysuria, flank pain, frequency and pelvic pain.  Musculoskeletal:  Negative for back pain, gait problem, joint swelling, myalgias and neck pain.  Skin:  Negative for pallor and wound.  Allergic/Immunologic: Positive for immunocompromised state.  Neurological:  Negative for dizziness, weakness, light-headedness, numbness and headaches.  Hematological:  Does not bruise/bleed easily.  Psychiatric/Behavioral: Negative.     Physical Exam Updated Vital Signs BP (!) 147/69 (BP Location: Left Arm)    Pulse (!) 52    Temp 98.1 F (36.7 C) (Oral)    Resp 19    Ht 5\' 6"  (1.676 m)    Wt 92.1 kg    SpO2 100%    BMI 32.77 kg/m  Physical Exam Vitals and nursing note reviewed.  Constitutional:      General: She is not in acute distress.    Appearance: She is well-developed. She is not ill-appearing, toxic-appearing or diaphoretic.  HENT:     Head: Normocephalic and atraumatic.     Mouth/Throat:     Mouth: Mucous membranes are moist.     Pharynx: Oropharynx is clear.  Eyes:     General: No scleral icterus.    Conjunctiva/sclera: Conjunctivae normal.  Cardiovascular:     Rate and Rhythm: Normal rate and regular rhythm.     Heart sounds: No murmur heard. Pulmonary:     Effort: Pulmonary effort is normal. No respiratory distress.     Breath sounds: Normal breath sounds. No wheezing or rales.  Abdominal:     Palpations: Abdomen is soft.     Tenderness: There is abdominal tenderness in the suprapubic area. There is no right CVA tenderness, left CVA tenderness, guarding or rebound.   Musculoskeletal:        General: No swelling.     Cervical back: Neck supple.  Skin:    General: Skin is warm and dry.     Capillary Refill: Capillary refill takes less than 2 seconds.     Coloration: Skin is not cyanotic, jaundiced or pale.  Neurological:     General: No focal deficit present.     Mental Status: She is alert and oriented to person, place, and time.     Cranial Nerves: No cranial nerve deficit.     Motor: No weakness.  Psychiatric:        Mood and Affect: Mood normal.        Behavior: Behavior  normal.    ED Results / Procedures / Treatments   Labs (all labs ordered are listed, but only abnormal results are displayed) Labs Reviewed  COMPREHENSIVE METABOLIC PANEL - Abnormal; Notable for the following components:      Result Value   CO2 21 (*)    Glucose, Bld 280 (*)    BUN 33 (*)    Creatinine, Ser 2.51 (*)    Calcium 8.7 (*)    GFR, Estimated 20 (*)    All other components within normal limits  CBC WITH DIFFERENTIAL/PLATELET - Abnormal; Notable for the following components:   WBC 12.8 (*)    Hemoglobin 10.1 (*)    HCT 34.8 (*)    MCV 75.5 (*)    MCH 21.9 (*)    MCHC 29.0 (*)    RDW 17.3 (*)    Neutro Abs 11.6 (*)    Lymphs Abs 0.3 (*)    All other components within normal limits  URINALYSIS, ROUTINE W REFLEX MICROSCOPIC - Abnormal; Notable for the following components:   APPearance CLOUDY (*)    Hgb urine dipstick SMALL (*)    Protein, ur 100 (*)    Bacteria, UA MANY (*)    All other components within normal limits  MAGNESIUM - Abnormal; Notable for the following components:   Magnesium 1.5 (*)    All other components within normal limits  RESP PANEL BY RT-PCR (FLU A&B, COVID) ARPGX2  LIPASE, BLOOD  MYCOPHENOLIC ACID (CELLCEPT)    EKG EKG Interpretation  Date/Time:  Saturday January 10 2022 09:01:06 EST Ventricular Rate:  50 PR Interval:  153 QRS Duration: 98 QT Interval:  456 QTC Calculation: 416 R Axis:   7 Text  Interpretation: Sinus rhythm Low voltage, precordial leads Confirmed by Godfrey Pick ([id number]) on 01/10/2022 9:57:58 AM  Radiology CT ABDOMEN PELVIS WO CONTRAST  Result Date: 01/10/2022 CLINICAL DATA:  66 year old female with abdominal pain, nausea vomiting. Weakness. EXAM: CT ABDOMEN AND PELVIS WITHOUT CONTRAST TECHNIQUE: Multidetector CT imaging of the abdomen and pelvis was performed following the standard protocol without IV contrast. RADIATION DOSE REDUCTION: This exam was performed according to the departmental dose-optimization program which includes automated exposure control, adjustment of the mA and/or kV according to patient size and/or use of iterative reconstruction technique. COMPARISON:  None. FINDINGS: Lower chest: Streaky lung base opacity, greater on the left more resembles atelectasis than infection. Cardiac size at the upper limits of normal. No pericardial or pleural effusion. Hepatobiliary: Previous cholecystectomy. Numerous right upper quadrant surgical clips. Overlying right upper abdominal wall postoperative changes with upper rectus muscle atrophy on that side. Negative noncontrast liver. Pancreas: Diminutive, negative. Spleen: Negative. Adrenals/Urinary Tract: Normal adrenal glands. Mild asymmetric right renal volume loss. No nephrolithiasis or hydronephrosis. No convincing pararenal inflammation. Both ureters are decompressed and negative to the bladder. Mild pelvic phleboliths. Stomach/Bowel: Occasional diverticula, with either calcified diverticula or chronically calcified small mesenteric lymph nodes in the transverse mesocolon. Decompressed large bowel throughout the abdomen and pelvis. Normal appendix tracking medially from the cecum on coronal image 85. No large bowel inflammation identified. Decompressed terminal ileum. No dilated small bowel. Decompressed stomach. Negative duodenum. No free air or free fluid. Vascular/Lymphatic: Extensive Aortoiliac calcified atherosclerosis.  Normal caliber abdominal aorta. No lymphadenopathy identified. Reproductive: Negative noncontrast appearance. Other: No pelvic free fluid. Musculoskeletal: Chronic T11 superior endplate deformity could be remote compression fracture or large Schmorl's node. Lower lumbar facet arthropathy. No acute osseous abnormality identified. Flank calcified probable injection granulomas. IMPRESSION: 1. No acute  or inflammatory process identified in the noncontrast abdomen or pelvis. Normal appendix. 2. No urinary calculus or obstructive uropathy. Mild right renal atrophy. 3. Bilateral lung base opacity more resembles atelectasis than infection. 4. Aortic Atherosclerosis (ICD10-I70.0). Electronically Signed   By: Genevie Ann M.D.   On: 01/10/2022 13:03   DG Chest Portable 1 View  Result Date: 01/10/2022 CLINICAL DATA:  Cough and chills. EXAM: PORTABLE CHEST 1 VIEW COMPARISON:  12/23/2020 FINDINGS: Stable cardiomediastinal contours lung volumes are low. Bibasilar opacities identified compatible with atelectasis or airspace disease. No signs of pleural effusion or edema. IMPRESSION: Low lung volumes with bibasilar opacities compatible with atelectasis or airspace disease. Electronically Signed   By: Kerby Moors M.D.   On: 01/10/2022 09:46    Procedures Procedures    Medications Ordered in ED Medications  metoCLOPramide (REGLAN) injection 10 mg (10 mg Intravenous Given 01/10/22 1103)  lactated ringers bolus 1,000 mL (0 mLs Intravenous Stopped 01/10/22 1230)  cefTRIAXone (ROCEPHIN) 2 g in sodium chloride 0.9 % 100 mL IVPB (0 g Intravenous Stopped 01/10/22 1523)  magnesium oxide (MAG-OX) tablet 800 mg (800 mg Oral Given 01/10/22 1528)    ED Course/ Medical Decision Making/ A&P                           Medical Decision Making Amount and/or Complexity of Data Reviewed Labs: ordered. Radiology: ordered.  Risk OTC drugs. Prescription drug management.   This patient presents to the ED for concern of chills,  cough, shortness of breath, generalized weakness, vomiting, this involves an extensive number of treatment options, and is a complaint that carries with it a high risk of complications and morbidity.  The differential diagnosis includes pneumonia, intra-abdominal infection, UTI.   Co morbidities that complicate the patient evaluation  DM2, history of liver transplant, CKD, chronic pain, long-term opioid use   Additional history obtained:  Additional history obtained from N/A External records from outside source obtained and reviewed including EMR   Lab Tests:  I Ordered, and personally interpreted labs.  The pertinent results include: Mild leukocytosis, baseline anemia, baseline CKD, mildly elevated blood glucose with no anion gap; possible early UTI   Imaging Studies ordered:  I ordered imaging studies including chest x-ray, CT of abdomen pelvis I independently visualized and interpreted imaging which showed findings and bibasilar lungs concerning for possible early pneumonia versus atelectasis; no acute intra-abdominal findings I agree with the radiologist interpretation   Cardiac Monitoring:  The patient was maintained on a cardiac monitor.  I personally viewed and interpreted the cardiac monitored which showed an underlying rhythm of: Sinus rhythm   Medicines ordered and prescription drug management:  I ordered medication including IV fluids for decreased p.o. intake; Reglan for symptomatic relief of nausea; magnesium oxide for hypomagnesemia; ceftriaxone for empiric treatment of pneumonia and/for UTI Reevaluation of the patient after these medicines showed that the patient resolved I have reviewed the patients home medicines and have made adjustments as needed   Problem List / ED Course:  66 year old female with history of DM2, CKD, and liver transplant, presenting for multiple symptoms starting today: Cough, nausea, vomiting, chills.  She is well-appearing on arrival.   Heart rate is low which she states is baseline for her.  She is afebrile normotensive.  Given her decreased p.o. intake, IV fluids were given.  Patient's nausea was treated with Reglan with good effect.  Diagnostic work-up is concerning for possible early UTI and/or early pneumonia.  Patient was given dose of ceftriaxone for treatment of both.  She was also prescribed cefdinir and azithromycin for outpatient management.  Given her chronic medical conditions and current use of immunosuppressive medications, patient is at risk for worsening infection.  She states that she does prefer trial of outpatient therapy and does understand that she should have a low threshold to return to the ED if she does have worsening symptoms despite antibiotic therapy..  Zofran was prescribed as well.  Patient was discharged in stable condition.   Reevaluation:  After the interventions noted above, I reevaluated the patient and found that they have :resolved   Social Determinants of Health:  Patient has access to outpatient medical care.  She is followed by [facility] for her chronic conditions.   Dispostion:  After consideration of the diagnostic results and the patients response to treatment, I feel that the patent would benefit from discharge with close outpatient follow-up and strict return cautions.        Final Clinical Impression(s) / ED Diagnoses Final diagnoses:  Generalized abdominal pain  Acute cough    Rx / DC Orders ED Discharge Orders          Ordered    cefdinir (OMNICEF) 300 MG capsule  Daily        01/10/22 1510    azithromycin (ZITHROMAX Z-PAK) 250 MG tablet  Daily        01/10/22 1510    ondansetron (ZOFRAN-ODT) 4 MG disintegrating tablet  Every 8 hours PRN        01/10/22 1510              Godfrey Pick, MD 01/10/22 1719

## 2022-01-10 NOTE — ED Notes (Signed)
PO intake tolerated well.

## 2022-01-10 NOTE — ED Triage Notes (Addendum)
Pt arriving from home via EMS. Pt c/o cough, n/v/d, chills, SOB, and some weakness starting this morning. Per EMS, vomit was normal white bile. Pt has hx of diabetes. CBG 385 with EMS. EMS administered 4mg  zofran IM @ 0809.

## 2022-01-16 LAB — MYCOPHENOLIC ACID (CELLCEPT)
MPA Glucuronide: 63 ug/mL (ref 15–125)
MPA: 0.7 ug/mL — ABNORMAL LOW (ref 1.0–3.5)

## 2022-10-25 ENCOUNTER — Emergency Department (HOSPITAL_COMMUNITY)
Admission: EM | Admit: 2022-10-25 | Discharge: 2022-10-26 | Discharge status: Against Medical Advice | Payer: 59 | Attending: Emergency Medicine | Admitting: Emergency Medicine

## 2022-10-25 ENCOUNTER — Emergency Department (HOSPITAL_COMMUNITY): Payer: 59

## 2022-10-25 DIAGNOSIS — J029 Acute pharyngitis, unspecified: Secondary | ICD-10-CM | POA: Insufficient documentation

## 2022-10-25 DIAGNOSIS — J45909 Unspecified asthma, uncomplicated: Secondary | ICD-10-CM | POA: Diagnosis not present

## 2022-10-25 DIAGNOSIS — Z5321 Procedure and treatment not carried out due to patient leaving prior to being seen by health care provider: Secondary | ICD-10-CM | POA: Insufficient documentation

## 2022-10-25 DIAGNOSIS — R0789 Other chest pain: Secondary | ICD-10-CM | POA: Insufficient documentation

## 2022-10-25 DIAGNOSIS — R0602 Shortness of breath: Secondary | ICD-10-CM | POA: Insufficient documentation

## 2022-10-25 DIAGNOSIS — Z20822 Contact with and (suspected) exposure to covid-19: Secondary | ICD-10-CM | POA: Diagnosis not present

## 2022-10-25 LAB — BASIC METABOLIC PANEL
Anion gap: 11 (ref 5–15)
BUN: 28 mg/dL — ABNORMAL HIGH (ref 8–23)
CO2: 21 mmol/L — ABNORMAL LOW (ref 22–32)
Calcium: 9.2 mg/dL (ref 8.9–10.3)
Chloride: 109 mmol/L (ref 98–111)
Creatinine, Ser: 2.37 mg/dL — ABNORMAL HIGH (ref 0.44–1.00)
GFR, Estimated: 22 mL/min — ABNORMAL LOW (ref >60)
Glucose, Bld: 190 mg/dL — ABNORMAL HIGH (ref 70–99)
Potassium: 4.9 mmol/L (ref 3.5–5.1)
Sodium: 141 mmol/L (ref 135–145)

## 2022-10-25 LAB — CBC
HCT: 32 % — ABNORMAL LOW (ref 36.0–46.0)
Hemoglobin: 9.5 g/dL — ABNORMAL LOW (ref 12.0–15.0)
MCH: 21.8 pg — ABNORMAL LOW (ref 26.0–34.0)
MCHC: 29.7 g/dL — ABNORMAL LOW (ref 30.0–36.0)
MCV: 73.6 fL — ABNORMAL LOW (ref 80.0–100.0)
Platelets: 182 10*3/uL (ref 150–400)
RBC: 4.35 MIL/uL (ref 3.87–5.11)
RDW: 16.2 % — ABNORMAL HIGH (ref 11.5–15.5)
WBC: 7.4 10*3/uL (ref 4.0–10.5)
nRBC: 0 % (ref 0.0–0.2)

## 2022-10-25 LAB — TROPONIN I (HIGH SENSITIVITY): Troponin I (High Sensitivity): 13 ng/L (ref <18)

## 2022-10-25 LAB — RESP PANEL BY RT-PCR (FLU A&B, COVID) ARPGX2
Influenza A by PCR: NEGATIVE
Influenza B by PCR: NEGATIVE
SARS Coronavirus 2 by RT PCR: NEGATIVE

## 2022-10-25 NOTE — ED Provider Triage Note (Signed)
Emergency Medicine Provider Triage Evaluation Note  Tammy Wilcox , a 66 y.o. female  was evaluated in triage.  Pt complains of 3-day history of shortness of breath, chest tightness, ear pressure and sore throat.  No fevers reported.  She denies associated nasal congestion.  No nausea, vomiting, diarrhea.  No history of heart problems.  Does states that she has had asthma.  Review of Systems  Positive: Chest tightness, shortness of breath Negative: Fever  Physical Exam  BP (!) 144/60   Pulse 62   Temp 98.3 F (36.8 C) (Oral)   Resp 16   SpO2 98%  Gen:   Awake, no distress   Resp:  Normal effort, lungs clear to auscultation bilaterally MSK:   Moves extremities without difficulty  Other:  Mild bradycardia, soft murmur left sternal border  Medical Decision Making  Medically screening exam initiated at 8:39 PM.  Appropriate orders placed.  Tammy Wilcox was informed that the remainder of the evaluation will be completed by another provider, this initial triage assessment does not replace that evaluation, and the importance of remaining in the ED until their evaluation is complete.     Carlisle Cater, PA-C 10/25/22 2050

## 2022-10-25 NOTE — ED Triage Notes (Signed)
Pt c/o sudden aching/ muffled feeling in ear, throat pain/ cough, CP/ SHOB w exertion x3 days. Hx asthma, reports no rescue inhaler use.

## 2022-10-26 ENCOUNTER — Encounter (HOSPITAL_COMMUNITY): Payer: Self-pay | Admitting: Emergency Medicine

## 2022-10-26 ENCOUNTER — Ambulatory Visit (HOSPITAL_COMMUNITY)
Admission: EM | Admit: 2022-10-26 | Discharge: 2022-10-26 | Disposition: A | Discharge status: Home / Self Care | Payer: 59 | Attending: Internal Medicine | Admitting: Internal Medicine

## 2022-10-26 DIAGNOSIS — J069 Acute upper respiratory infection, unspecified: Secondary | ICD-10-CM

## 2022-10-26 MED ORDER — LORATADINE 10 MG PO TABS: 10.0000 mg | ORAL_TABLET | Freq: Every day | ORAL | 0 refills | Status: DC

## 2022-10-26 MED ORDER — GUAIFENESIN ER 1200 MG PO TB12: 1200.0000 mg | ORAL_TABLET | Freq: Two times a day (BID) | ORAL | 0 refills | Status: AC

## 2022-10-26 MED ORDER — BENZONATATE 100 MG PO CAPS: 100.0000 mg | ORAL_CAPSULE | Freq: Three times a day (TID) | ORAL | 0 refills | Status: DC

## 2022-10-26 NOTE — ED Provider Notes (Signed)
[facility]    CSN: [id number] Arrival date & time: 10/26/22  1031      History   Chief Complaint Chief Complaint  Patient presents with   Cough   Headache   Otalgia   Chest Pain    HPI Tammy Wilcox is a 66 y.o. female.   Patient presents to urgent care for evaluation of cough, nasal congestion, and frontal headache that started approximately 1 week ago. Cough is dry and worse at nighttime. Her ears have also been bothering her for about 1 week, no decreased hearing or muffled sounds to the ears. Headache is to the frontal aspect of the head. No dizziness, blurry vision, or decreased visual acuity reported.  She is not a current smoker but states that she used to be and quit approximately 10 years ago.  Denies history of asthma and COPD but states that she "has a pump that she uses every so often to help with shortness of breath as needed".  She has not needed to use her inhaler while sick with current illness.  Denies body aches, sore throat, fever/chills, current shortness of breath, chest pain, dizziness, and abdominal pain.  No known sick contacts.  She has been taking Tylenol Cold and flu over-the-counter with some relief.  Last dose of Tylenol Cold and flu was this morning prior to arrival urgent care.  Patient was seen in the emergency department yesterday for her symptoms but left without being seen due to long wait time.    Cough Associated symptoms: chest pain, ear pain and headaches   Headache Associated symptoms: cough and ear pain   Otalgia Associated symptoms: cough and headaches   Chest Pain Associated symptoms: cough and headache     Past Medical History:  Diagnosis Date   Diabetes mellitus without complication ([place])    Hypertension     There are no problems to display for this patient.   Past Surgical History:  Procedure Laterality Date   liver transplant      OB History   No obstetric history on file.      Home Medications    Prior  to Admission medications   Medication Sig Start Date End Date Taking? Authorizing Provider  benzonatate (TESSALON) 100 MG capsule Take 1 capsule (100 mg total) by mouth every 8 (eight) hours. 10/26/22  Yes Talbot Grumbling, FNP  Guaifenesin 1200 MG TB12 Take 1 tablet (1,200 mg total) by mouth in the morning and at bedtime. 10/26/22  Yes Talbot Grumbling, FNP  loratadine (CLARITIN) 10 MG tablet Take 1 tablet (10 mg total) by mouth daily. 10/26/22  Yes Talbot Grumbling, FNP  amLODipine (NORVASC) 10 MG tablet Take 10 mg by mouth every evening.    [provider]  atorvastatin (LIPITOR) 10 MG tablet Take 10 mg by mouth daily.    [provider]  azithromycin (ZITHROMAX Z-PAK) 250 MG tablet Take 1 tablet (250 mg total) by mouth daily. Take 2 tablets on day 1 01/10/22   Godfrey Pick, MD  bimatoprost (LUMIGAN) 0.01 % SOLN Place 1 drop into both eyes at bedtime.    [provider]  cloNIDine (CATAPRES) 0.1 MG tablet Take 0.1 mg by mouth 2 (two) times daily.    [provider]  cycloSPORINE modified (NEORAL) 25 MG capsule Take 75 mg by mouth every 12 (twelve) hours.    [provider]  DULoxetine (CYMBALTA) 30 MG capsule Take 30 mg by mouth daily.    [provider]  furosemide (LASIX) 40 MG tablet Take 40 mg by mouth daily.    [provider]  gabapentin (NEURONTIN) 600 MG tablet Take 600 mg by mouth 3 (three) times daily. 09/02/17   [provider]  hydrALAZINE (APRESOLINE) 10 MG tablet Take 10 mg by mouth 3 (three) times daily.    [provider]  ibuprofen (ADVIL) 600 MG tablet Take 1 tablet (600 mg total) by mouth every 6 (six) hours as needed. 10/06/20   Domenic Moras, PA-C  insulin glargine (LANTUS) 100 UNIT/ML injection Inject 20 Units into the skin at bedtime.    [provider]  insulin lispro (HUMALOG KWIKPEN) 100 UNIT/ML KwikPen Inject 8 Units into the skin 3 (three) times daily with meals.     [provider]  levothyroxine (SYNTHROID, LEVOTHROID) 112 MCG tablet Take 112 mcg by mouth every morning.    [provider]  lidocaine (XYLOCAINE) 2 % solution Use as directed 15 mLs in the mouth or throat as needed for mouth pain. 10/06/20   Domenic Moras, PA-C  lisinopril (PRINIVIL,ZESTRIL) 10 MG tablet Take 10 mg by mouth daily.    [provider]  mycophenolate (MYFORTIC) 180 MG EC tablet Take 360 mg by mouth 2 (two) times daily.    [provider]  omeprazole (PRILOSEC) 20 MG capsule Take 20 mg by mouth daily.    [provider]  ondansetron (ZOFRAN-ODT) 4 MG disintegrating tablet Take 1 tablet (4 mg total) by mouth every 8 (eight) hours as needed for nausea or vomiting. 01/10/22   Godfrey Pick, MD  tiZANidine (ZANAFLEX) 4 MG tablet Take 4 mg by mouth every 6 (six) hours.    [provider]    Family History History reviewed. No pertinent family history.  Social History Social History   Tobacco Use   Smoking status: Unknown   Smokeless tobacco: Never  Substance Use Topics   Alcohol use: Never   Drug use: Never     Allergies   Patient has no known allergies.   Review of Systems Review of Systems  HENT:  Positive for ear pain.   Respiratory:  Positive for cough.   Cardiovascular:  Positive for chest pain.  Neurological:  Positive for headaches.  Per HPI   Physical Exam Triage Vital Signs ED Triage Vitals [10/26/22 1236]  Enc Vitals Group     BP (!) 181/78     Pulse Rate (!) 46     Resp 16     Temp 98.1 F (36.7 C)     Temp Source Oral     SpO2 98 %     Weight      Height      Head Circumference      Peak Flow      Pain Score 10     Pain Loc      Pain Edu?      Excl. in [employer]?    No data found.  Updated Vital Signs BP (!) 181/78 Comment: has not taken BP meds today.  Pulse (!) 46   Temp 98.1 F (36.7 C) (Oral)   Resp 16   SpO2 98%   Visual Acuity Right Eye Distance:   Left Eye Distance:    Bilateral Distance:    Right Eye Near:   Left Eye Near:    Bilateral Near:     Physical Exam Vitals and nursing note reviewed.  Constitutional:      Appearance: She is obese. She is not ill-appearing  or toxic-appearing.  HENT:     Head: Normocephalic and atraumatic.     Right Ear: Hearing, tympanic membrane, ear canal and external ear normal.     Left Ear: Hearing, tympanic membrane, ear canal and external ear normal.     Nose: Congestion and rhinorrhea present. Rhinorrhea is clear.     Right Turbinates: Swollen and pale.     Left Turbinates: Swollen and pale.     Mouth/Throat:     Lips: Pink.     Mouth: Mucous membranes are moist.     Pharynx: Oropharynx is clear. No pharyngeal swelling or posterior oropharyngeal erythema.     Tonsils: No tonsillar exudate. 0 on the right. 0 on the left.     Comments: Small amount of clear postnasal drainage visualized to the posterior oropharynx.  Eyes:     General: Lids are normal. Vision grossly intact. Gaze aligned appropriately.        Right eye: No discharge.        Left eye: No discharge.     Extraocular Movements: Extraocular movements intact.     Conjunctiva/sclera: Conjunctivae normal.     Pupils: Pupils are equal, round, and reactive to light.     Comments: EOMs are normal without pain or dizziness elicited.  Cardiovascular:     Rate and Rhythm: Normal rate and regular rhythm.     Heart sounds: Normal heart sounds, S1 normal and S2 normal.  Pulmonary:     Effort: Pulmonary effort is normal. No respiratory distress.     Breath sounds: Normal breath sounds and air entry.     Comments: No adventitious lung sounds heard to auscultation bilaterally.  No respiratory distress. Musculoskeletal:     Cervical back: Normal range of motion and neck supple.  Lymphadenopathy:     Cervical: Cervical adenopathy present.  Skin:    General: Skin is warm and dry.     Capillary Refill: Capillary refill takes less than 2 seconds.     Findings:  No rash.  Neurological:     General: No focal deficit present.     Mental Status: She is alert and oriented to person, place, and time. Mental status is at baseline.     Cranial Nerves: No dysarthria or facial asymmetry.  Psychiatric:        Mood and Affect: Mood normal.        Speech: Speech normal.        Behavior: Behavior normal.        Thought Content: Thought content normal.        Judgment: Judgment normal.      UC Treatments / Results  Labs (all labs ordered are listed, but only abnormal results are displayed) Labs Reviewed - No data to display  EKG   Radiology DG Chest 2 View  Result Date: 10/25/2022 CLINICAL DATA:  Shortness of breath. Chest pain. Sore throat. EXAM: CHEST - 2 VIEW COMPARISON:  Radiograph 01/10/2022 FINDINGS: Low lung volumes persist. Heart size upper normal. There is bilateral hilar prominence that is stable from prior exam. Subsegmental atelectasis or scarring in left mid lung and right lung base. No pleural effusion or pneumothorax. No acute osseous findings. IMPRESSION: Low lung volumes with subsegmental atelectasis or scarring. Electronically Signed   By: Keith Rake M.D.   On: 10/25/2022 21:16    Procedures Procedures (including critical care time)  Medications Ordered in UC Medications - No data to display  Initial Impression / Assessment and Plan / UC Course  I have reviewed the triage vital signs and the nursing notes.  Pertinent labs & imaging results that were available during my care of the patient were reviewed by me and considered in my medical decision making (see chart for details).   1.  Viral URI with cough ER work-up yesterday is unremarkable.  CBC and BMP reviewed and are without acute abnormality.  Chest x-ray also reviewed and is unremarkable for acute cardiopulmonary process.  COVID-19 and influenza testing performed yesterday is negative.  Etiology is consistent with a viral URI that will likely resolve in the next few  days with rest, fluids, and medications for supportive care.  Patient to take Claritin 10 mg once daily to help dry up postnasal drainage causing cough.  Creatinine clearance is 33 mL/min and therefore she may take this once daily without dosage adjustment per up-to-date.  She may take guaifenesin 1200 mg every 12 hours as needed for cough and congestion.  She may take Tessalon Perles every 8 hours as needed for cough.  She may take Tylenol every 6 hours as needed for head pain.  Last dose of Tylenol was prior to arrival urgent care so therefore we deferred a dose here.  She is not a candidate for NSAID therapy due to chronic kidney disease.  Kidney function is stable based on recent blood work.   Discussed physical exam and available lab work findings in clinic with patient.  Counseled patient regarding appropriate use of medications and potential side effects for all medications recommended or prescribed today. Discussed red flag signs and symptoms of worsening condition,when to call the PCP office, return to urgent care, and when to seek higher level of care in the emergency department. Patient verbalizes understanding and agreement with plan. All questions answered. Patient discharged in stable condition.   Final Clinical Impressions(s) / UC Diagnoses   Final diagnoses:  Viral URI with cough     Discharge Instructions      Your test results from yesterday in the emergency room show that you do not have COVID and you do not have flu.  Your chest x-ray was normal and so was your blood work.  You likely have a viral upper respiratory infection causing your symptoms that will get better in the next couple of days with rest, fluids, and medications to help with your symptoms.  Take Claritin 10 mg once daily to dry up post-nasal drainage.  Take guaifenesin 1200mg   every 12 hours to thin your mucous so that you can get it out of your body easier with coughing/blowing your nose. Drink plenty of  water while taking this medication so that it works well in your body (at least 8 cups a day).   Take tessalon pearles every 8 hours as needed for cough.  You may continue to take tylenol every 6 hours as needed for aches and pains including headache.  If you develop any new or worsening symptoms, please return.  If your symptoms are severe, please go to the emergency room.  Follow-up with your primary care provider for further evaluation and management of your symptoms as well as ongoing wellness visits.  I hope you feel better!      ED Prescriptions     Medication Sig Dispense Auth. Provider   Guaifenesin 1200 MG TB12 Take 1 tablet (1,200 mg total) by mouth in the morning and at bedtime. 14 tablet Joella Prince M, FNP   benzonatate (TESSALON) 100 MG capsule Take 1 capsule (100 mg total)  by mouth every 8 (eight) hours. 21 capsule Joella Prince M, FNP   loratadine (CLARITIN) 10 MG tablet Take 1 tablet (10 mg total) by mouth daily. 30 tablet Talbot Grumbling, FNP      PDMP not reviewed this encounter.   Talbot Grumbling, [place] 10/26/22 1356

## 2022-10-26 NOTE — Discharge Instructions (Addendum)
Your test results from yesterday in the emergency room show that you do not have COVID and you do not have flu.  Your chest x-ray was normal and so was your blood work.  You likely have a viral upper respiratory infection causing your symptoms that will get better in the next couple of days with rest, fluids, and medications to help with your symptoms.  Take Claritin 10 mg once daily to dry up post-nasal drainage.  Take guaifenesin 1200mg   every 12 hours to thin your mucous so that you can get it out of your body easier with coughing/blowing your nose. Drink plenty of water while taking this medication so that it works well in your body (at least 8 cups a day).   Take tessalon pearles every 8 hours as needed for cough.  You may continue to take tylenol every 6 hours as needed for aches and pains including headache.  If you develop any new or worsening symptoms, please return.  If your symptoms are severe, please go to the emergency room.  Follow-up with your primary care provider for further evaluation and management of your symptoms as well as ongoing wellness visits.  I hope you feel better!

## 2022-10-26 NOTE — ED Notes (Signed)
Pt decided to leave while waiting for a room.  

## 2022-10-26 NOTE — ED Triage Notes (Addendum)
Pt reports a headache x 2 days, bilateral ear pain x 1 week, sore throat x 1 week and CP x 3 days. Went to the ED lastnight and LWBS due to long wait.

## 2023-04-12 ENCOUNTER — Ambulatory Visit (HOSPITAL_COMMUNITY)
Admission: EM | Admit: 2023-04-12 | Discharge: 2023-04-12 | Disposition: A | Discharge status: Home / Self Care | Payer: 59 | Attending: Family Medicine | Admitting: Family Medicine

## 2023-04-12 ENCOUNTER — Encounter (HOSPITAL_COMMUNITY): Payer: Self-pay

## 2023-04-12 DIAGNOSIS — K0889 Other specified disorders of teeth and supporting structures: Secondary | ICD-10-CM | POA: Diagnosis not present

## 2023-04-12 MED ORDER — IBUPROFEN 800 MG PO TABS: 800.0000 mg | ORAL_TABLET | Freq: Three times a day (TID) | ORAL | 0 refills | Status: DC

## 2023-04-12 MED ORDER — AMOXICILLIN 875 MG PO TABS: 875.0000 mg | ORAL_TABLET | Freq: Two times a day (BID) | ORAL | 0 refills | Status: AC

## 2023-04-12 NOTE — ED Triage Notes (Signed)
Pt reports dental problem for several days.

## 2023-04-14 NOTE — ED Provider Notes (Signed)
  [facility]   [id number] 04/12/23 Arrival Time: 1011  ASSESSMENT & PLAN:  1. Pain, dental    No sign of abscess requiring I&D at this time. Discussed.  Meds ordered this encounter  Medications   amoxicillin (AMOXIL) 875 MG tablet    Sig: Take 1 tablet (875 mg total) by mouth 2 (two) times daily for 10 days.    Dispense:  20 tablet    Refill:  0   ibuprofen (ADVIL) 800 MG tablet    Sig: Take 1 tablet (800 mg total) by mouth 3 (three) times daily with meals.    Dispense:  21 tablet    Refill:  0    Dental resource written instructions given. She will schedule dental evaluation as soon as possible if not improving over the next 24-48 hours.  Reviewed expectations re: course of current medical issues. Questions answered. Outlined signs and symptoms indicating need for more acute intervention. Patient verbalized understanding. After Visit Summary given.   SUBJECTIVE:  Tammy Wilcox is a 67 y.o. female who reports gradual onset of right lower dental pain described as aching. Present for 2-3 days. Fever: absent. Tolerating PO intake but reports pain with chewing. Normal swallowing. She does not see a dentist regularly. No neck swelling or pain. OTC analgesics without relief.   OBJECTIVE: Vitals:   04/12/23 1203  BP: (!) 149/69  Pulse: 82  Resp: 16  Temp: 98.1 F (36.7 C)  TempSrc: Oral  SpO2: 98%    General appearance: alert; no distress HENT: normocephalic; atraumatic; dentition: fair; right lower gum without areas of fluctuance, drainage, or bleeding and with tenderness to palpation; normal jaw movement without difficulty Neck: supple without LAD; FROM; trachea midline Lungs: normal respirations; unlabored; speaks full sentences without difficulty Skin: warm and dry Psychological: alert and cooperative; normal mood and affect  No Known Allergies  Past Medical History:  Diagnosis Date   Diabetes mellitus without complication    Hypertension    Social  History   Socioeconomic History   Marital status: Divorced    Spouse name: Not on file   Number of children: Not on file   Years of education: Not on file   Highest education level: Not on file  Occupational History   Not on file  Tobacco Use   Smoking status: Unknown   Smokeless tobacco: Never  Substance and Sexual Activity   Alcohol use: Never   Drug use: Never   Sexual activity: Not on file  Other Topics Concern   Not on file  Social History Narrative   Not on file   Social Determinants of Health   Financial Resource Strain: Not on file  Food Insecurity: Not on file  Transportation Needs: Not on file  Physical Activity: Not on file  Stress: Not on file  Social Connections: Not on file  Intimate Partner Violence: Not on file   History reviewed. No pertinent family history. Past Surgical History:  Procedure Laterality Date   liver transplant        Mardella Layman, MD 04/14/23 [phone]

## 2023-07-16 IMAGING — DX DG CHEST 1V PORT
1 series · 1 of 1 positions shown · non-contrast
Comparison: 12/23/2020

CLINICAL DATA: Cough and chills.

EXAM:
PORTABLE CHEST 1 VIEW

[chest ap]
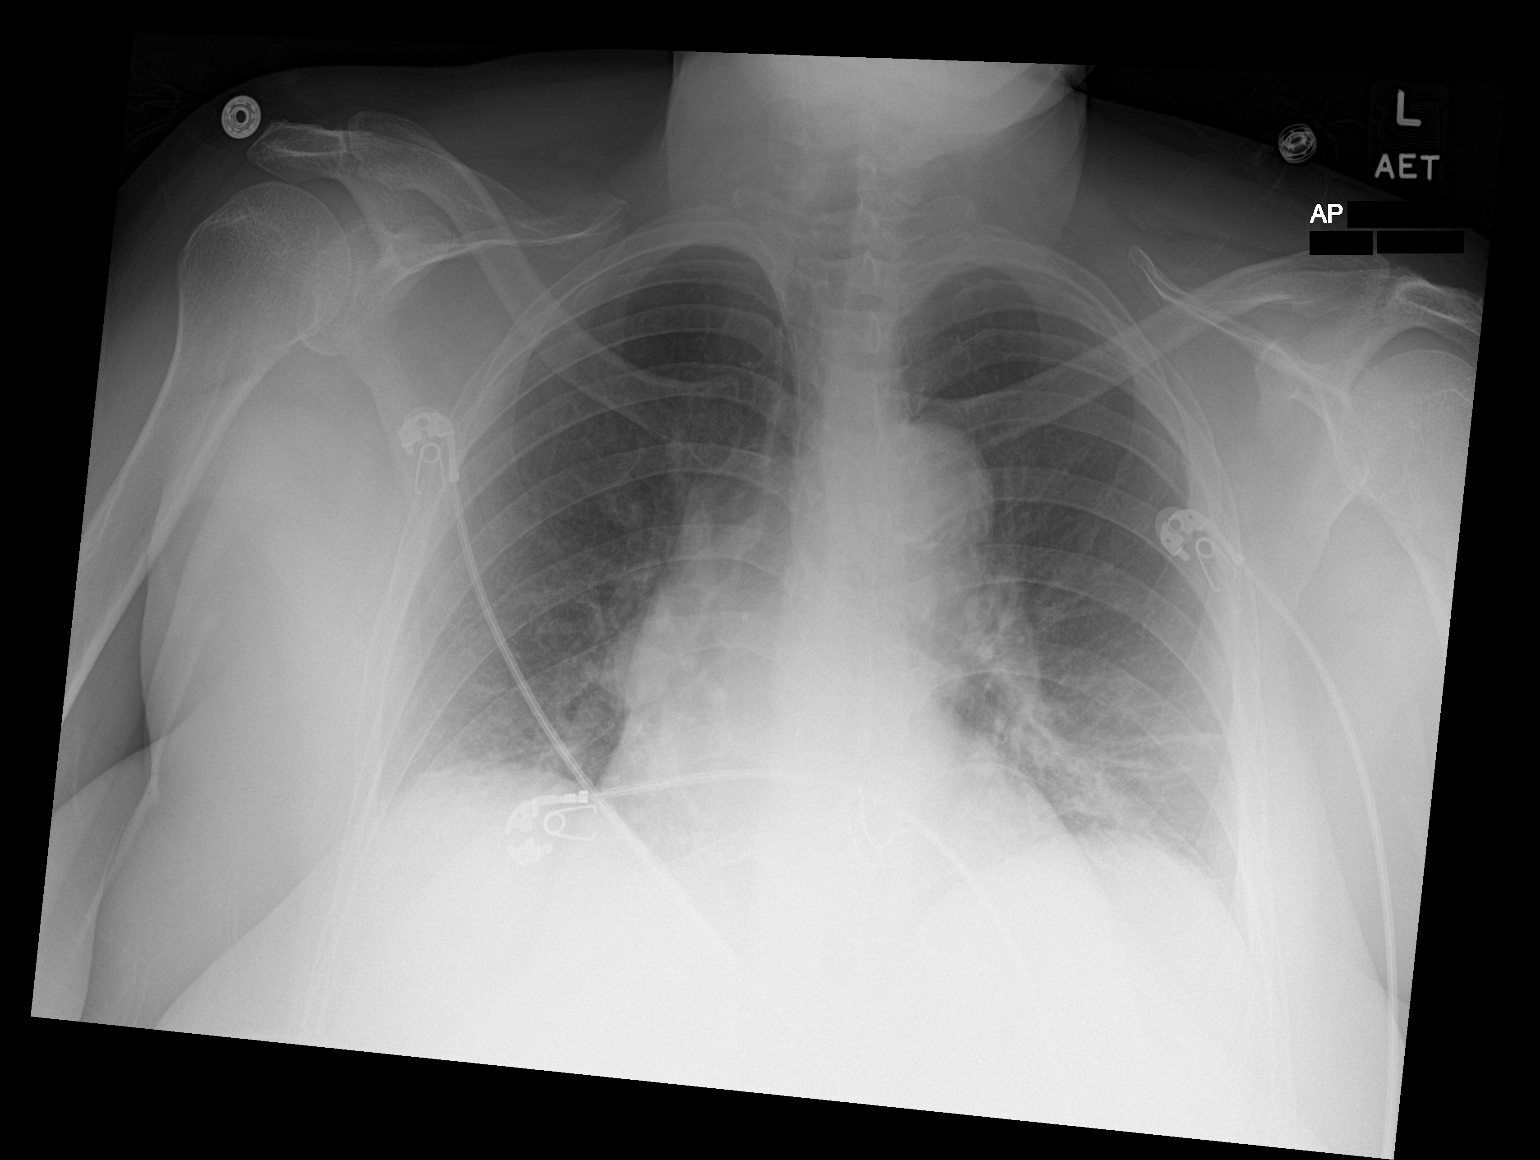

[1 of 1 positions shown; findings below may reference images not displayed]

FINDINGS: Stable cardiomediastinal contours lung volumes are low. Bibasilar
opacities identified compatible with atelectasis or airspace
disease. No signs of pleural effusion or edema.
IMPRESSION: Low lung volumes with bibasilar opacities compatible with
atelectasis or airspace disease.

## 2023-07-16 IMAGING — CT CT ABD-PELV W/O CM
2 of 4 series · 16 of 46 positions shown, 18 images · non-contrast
Comparison: None.

CLINICAL DATA: 65-year-old female with abdominal pain, nausea
vomiting. Weakness.



[Series 2: axial st · axial · 0.83mm/px · z∈[+1081,+1501]mm · 13 of 96 slices shown, 15 images]
[im 6/96  soft-tissue]
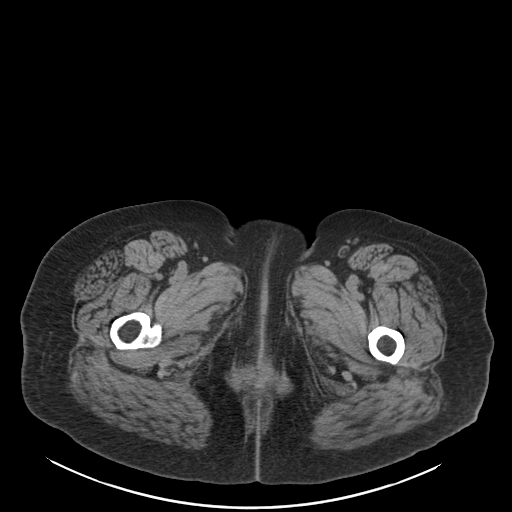
[im 6/96  bone]
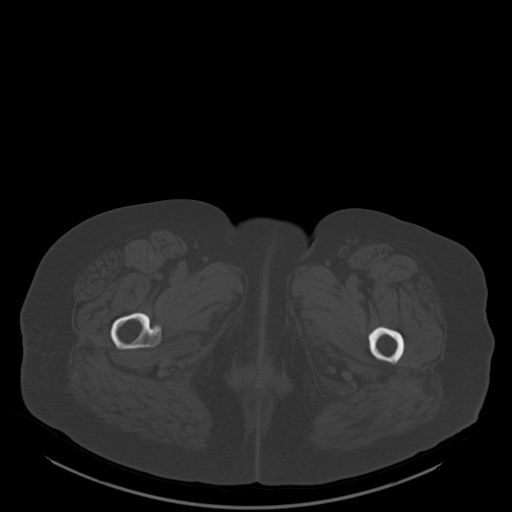
[im 11/96  soft-tissue]
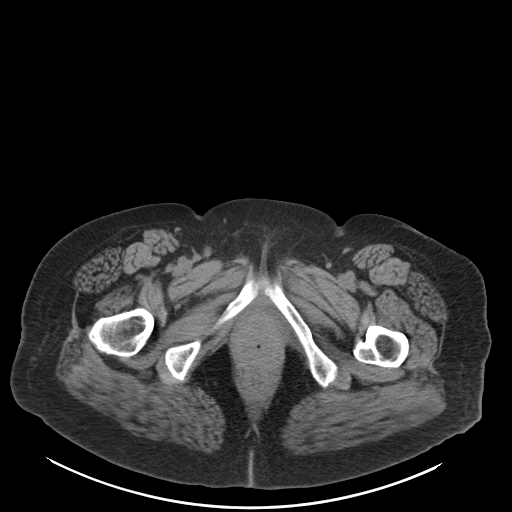
[im 22/96  soft-tissue]
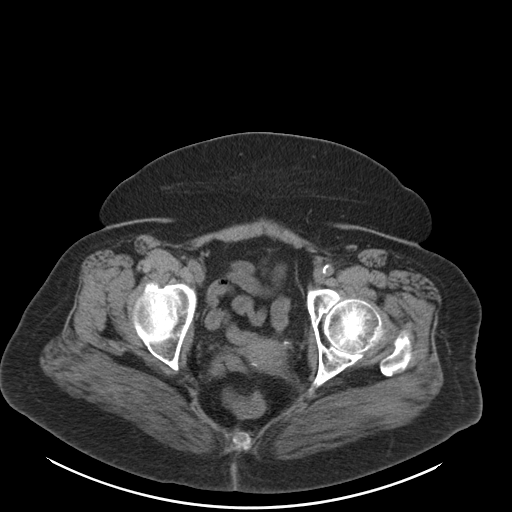
[im 27/96  soft-tissue]
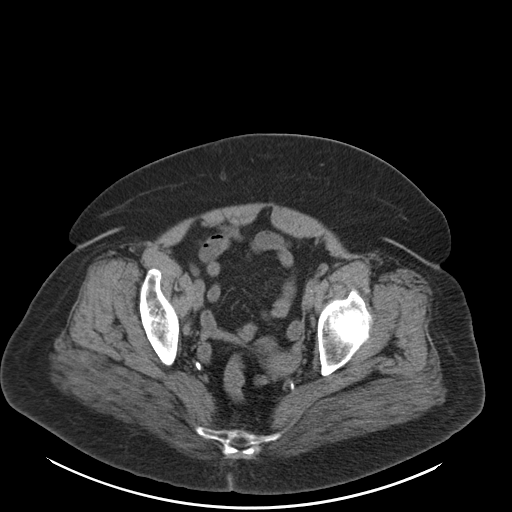
[im 32/96  soft-tissue]
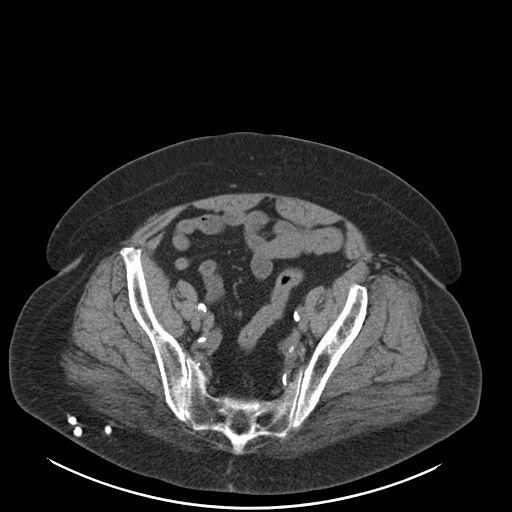
[im 43/96  soft-tissue]
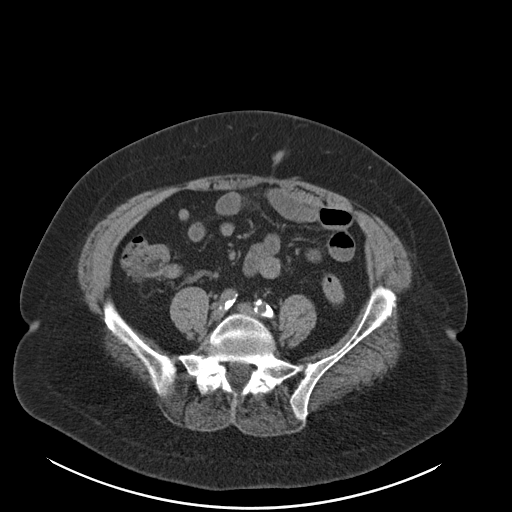
[im 48/96  soft-tissue]
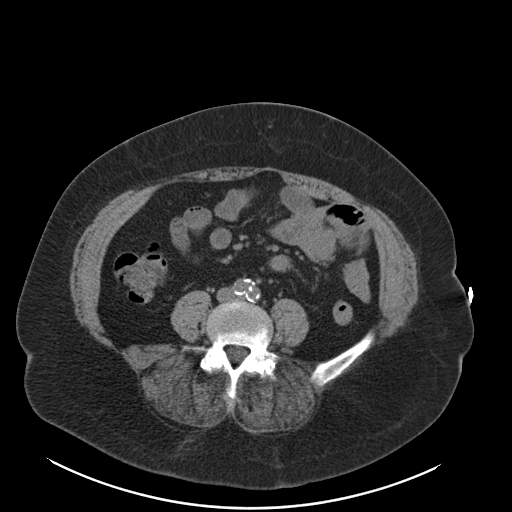
[im 53/96  soft-tissue]
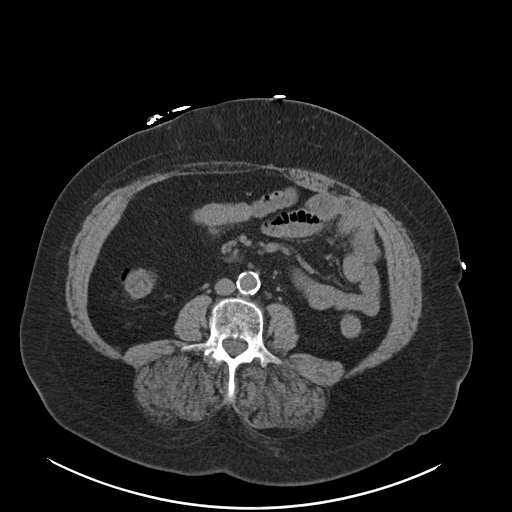
[im 64/96  soft-tissue]
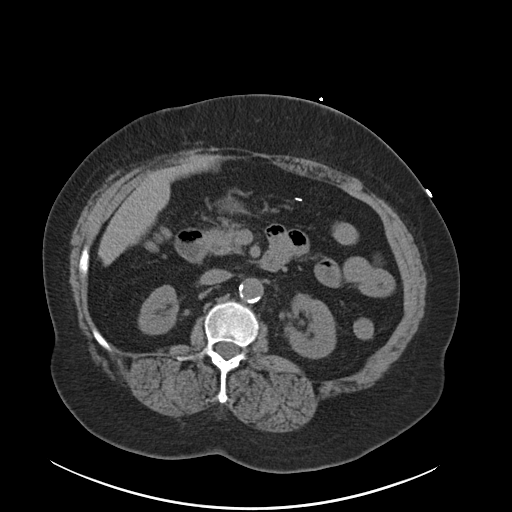
[im 64/96  bone]
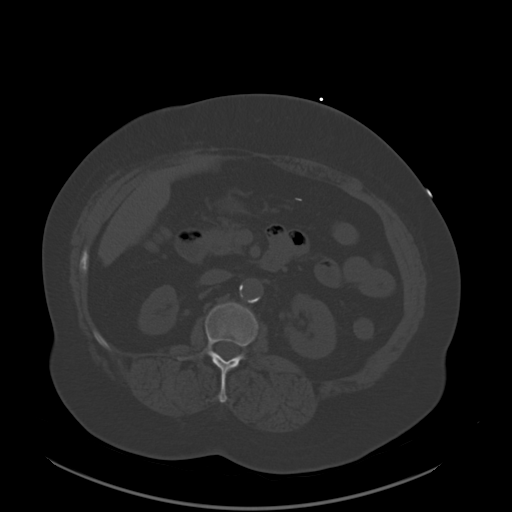
[im 69/96  soft-tissue]
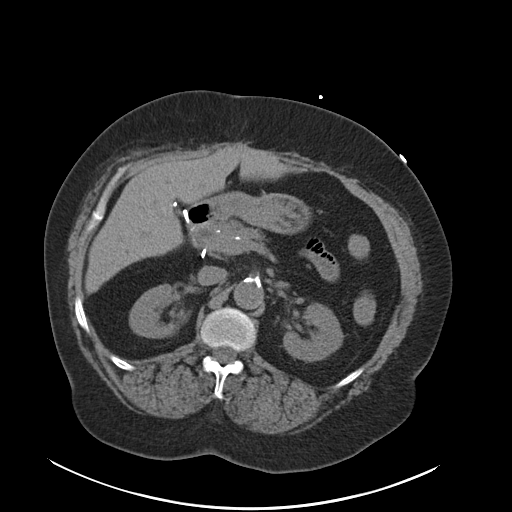
[im 74/96  soft-tissue]
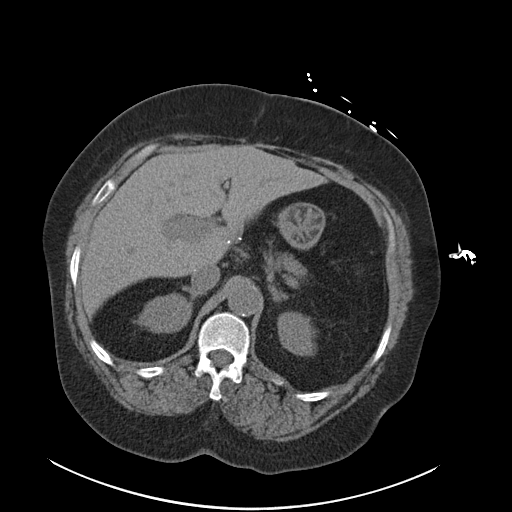
[im 85/96  soft-tissue]
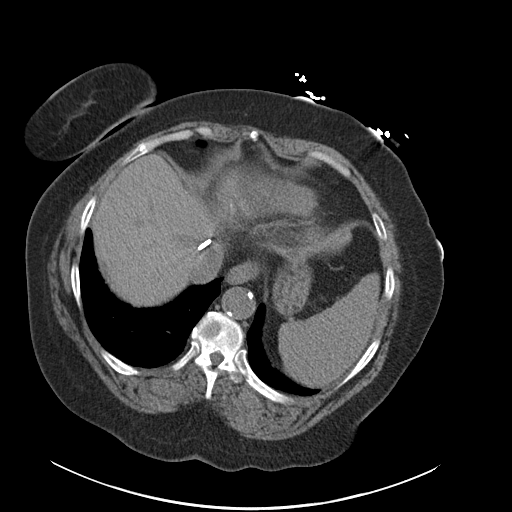
[im 90/96  soft-tissue]
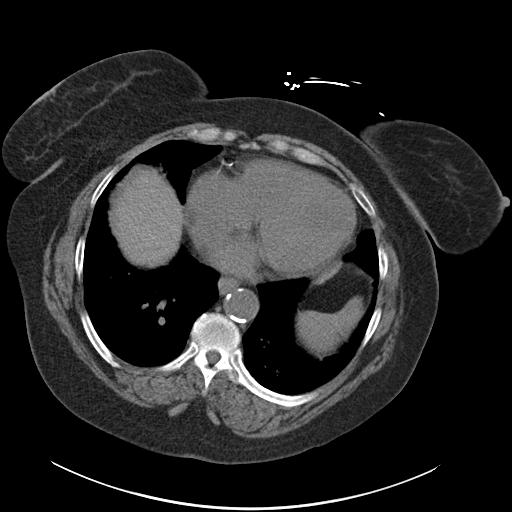

[Series 5: coronal st · coronal · 0.95mm/px · 3 of 163 slices shown]
[im 55/163  soft-tissue]
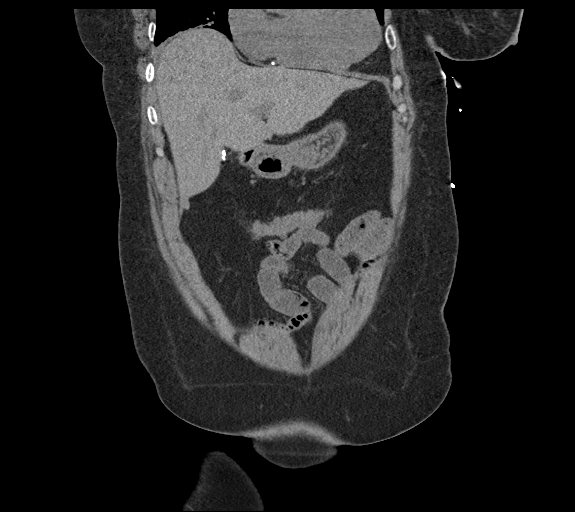
[im 73/163  soft-tissue]
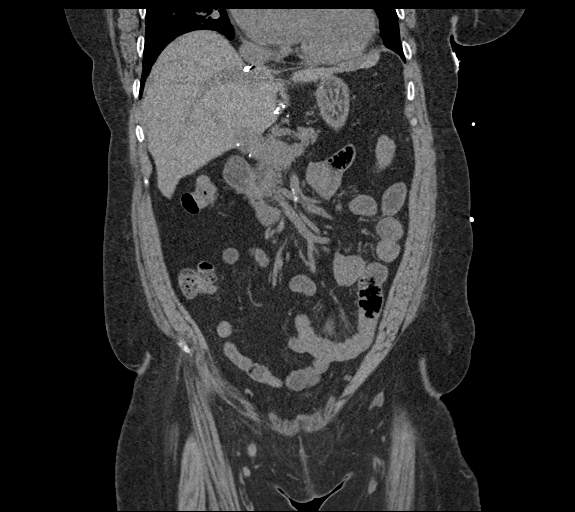
[im 91/163  soft-tissue]
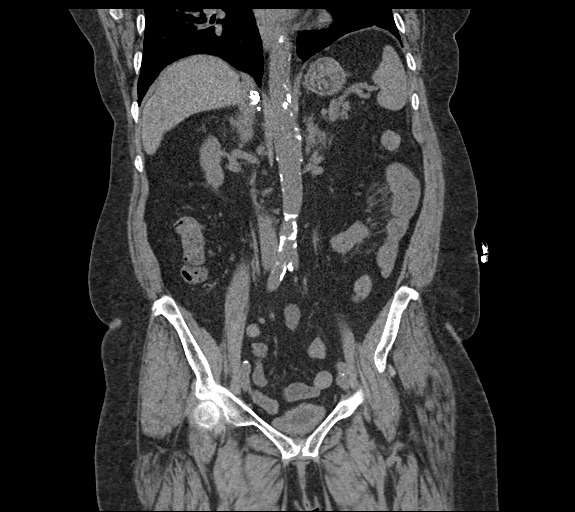

[16 of 46 positions shown; findings below may reference images not displayed]

FINDINGS: Lower chest: Streaky lung base opacity, greater on the left more
resembles atelectasis than infection. Cardiac size at the upper
limits of normal. No pericardial or pleural effusion.

Hepatobiliary: Previous cholecystectomy. Numerous right upper
quadrant surgical clips. Overlying right upper abdominal wall
postoperative changes with upper rectus muscle atrophy on that side.
Negative noncontrast liver.

Pancreas: Diminutive, negative.

Spleen: Negative.

Adrenals/Urinary Tract: Normal adrenal glands.

Mild asymmetric right renal volume loss. No nephrolithiasis or
hydronephrosis. No convincing pararenal inflammation. Both ureters
are decompressed and negative to the bladder. Mild pelvic
phleboliths.

Stomach/Bowel: Occasional diverticula, with either calcified
diverticula or chronically calcified small mesenteric lymph nodes in
the transverse mesocolon. Decompressed large bowel throughout the
abdomen and pelvis. Normal appendix tracking medially from the cecum
on coronal image 85. No large bowel inflammation identified.
Decompressed terminal ileum. No dilated small bowel. Decompressed
stomach. Negative duodenum. No free air or free fluid.

Vascular/Lymphatic: Extensive Aortoiliac calcified atherosclerosis.
Normal caliber abdominal aorta. No lymphadenopathy identified.

Reproductive: Negative noncontrast appearance.

Other: No pelvic free fluid.

Musculoskeletal: Chronic T11 superior endplate deformity could be
remote compression fracture or large Schmorl's node. Lower lumbar
facet arthropathy. No acute osseous abnormality identified. Flank
calcified probable injection granulomas.
IMPRESSION: 1. No acute or inflammatory process identified in the noncontrast
abdomen or pelvis. Normal appendix.
2. No urinary calculus or obstructive uropathy. Mild right renal
atrophy.
3. Bilateral lung base opacity more resembles atelectasis than
infection.
4. Aortic Atherosclerosis ([id number]-PCU.U).

## 2024-01-05 NOTE — Progress Notes (Signed)
 Reason for visit: Routine visit  History of Present Illness The patient presents for evaluation of diabetes mellitus and tinnitus.  Diabetes Mellitus - The patient reports a current body weight of 199 pounds, an increase from their previous weight noted during a virtual consultation. - They have been on Mounjaro (tirzepatide) since October, with the last dose administered on Sunday, and express a desire to continue this medication for diabetes management. - The medication has been effective in suppressing their appetite, resulting in decreased food intake. - The patient typically consumes one meal at 4:00 PM, followed by occasional snacks such as grapes, oranges, and bananas, and does not experience hunger after this meal. - They monitor their blood glucose levels at home and are not currently using insulin . - The patient has experienced episodes of hypoglycemia, presenting with symptoms of nervousness and tremors, attributed to inadequate food intake. - They also report episodes of hyperglycemia, which are associated with headaches. - Despite previous hypoglycemic episodes, the patient expresses a desire to resume insulin  therapy. - Additionally, they report symptoms of polyuria and polydipsia.  Tinnitus - The patient reports discomfort in the left ear, which their daughter has observed to be swollen. - They mention a recent sore throat and gingival swelling, with a dental appointment scheduled. - The patient describes tinnitus and intermittent hearing difficulties. - A hearing test conducted two years ago indicated a need for hearing aids.  MEDICATIONS - Current: Mounjaro  Blood pressure 135/52, pulse (!) 57, temperature 36.9 C (98.4 F), temperature source Oral, height 1.676 m, weight 90.3 kg, SpO2 100%.   Body mass index is 32.14 kg/m.   Physical Exam General Appearance: Normal. Vital signs: Weight: 199 pounds. HEENT: Minimal ear wax, no infection. Respiratory: Within normal  limits. Skin: Warm and dry, no rash. Neurological: Normal.  Results - Laboratory Studies:   - Average blood sugar: 187   - Highest blood sugar: 350    Assessment & Plan Diabetes mellitus. Blood glucose levels generally well-controlled, average 147, with spikes up to 350, likely due to Mounjaro started in October. Reports hypoglycemia likely due to inadequate food intake. Not on insulin . Hemoglobin A1c test today. stop Mounjaro. If A1c elevated, discuss reintroducing insulin . Will resume at 10 units daily.   Tinnitus. Reports tinnitus with buzzing/ringing in ears and occasional hearing difficulty. Referral for hearing test to evaluate auditory function and determine appropriate action.    Rtc 3 months  Level 4 visit Rx management Two chronic conditions  Immunization History  Administered Date(s) Administered  . Hep B, adult 07/02/2010, 08/20/2010, 03/02/2011  . Influenza, High-dose Seasonal, Quadrivalent, Preservative Free 11/30/2022  . Influenza, Unspecified 09/30/2010, 03/14/2012, 09/19/2015, 09/24/2016, 09/08/2017, 11/22/2018  . Influenza, high-dose, trivalent, PF 09/14/2023  . Influenza, injectable, quadrivalent, preservative free 10/03/2014, 09/19/2015, 09/02/2017  . Influenza, split virus, trivalent, preservative 03/14/2012  . Pfizer SARS-COV-2 Vaccination 06/19/2020, 07/10/2020  . Pneumococcal Conjugate PCV 13 11/22/2018  . Pneumococcal Polysaccharide PPV23 03/03/2012  . Tdap 03/27/2010, 06/02/2023     There are no preventive care reminders to display for this patient.   No Known Allergies Family History  Problem Relation Name Age of Onset  . Heart attack Mother    . Hypertension Mother    . Hypothyroidism Mother    . HIV Father    . Diabetes Maternal Grandmother    . Kidney failure Maternal Grandmother     Social History   Socioeconomic History  . Marital status: Single    Spouse name: Not on file  .  Number of children: Not on file  . Years of  education: Not on file  . Highest education level: Not on file  Occupational History  . Occupation: [employer]  Tobacco Use  . Smoking status: Former  . Smokeless tobacco: Never  Vaping Use  . Vaping status: Never Used  Substance and Sexual Activity  . Alcohol use: Never  . Drug use: Never  . Sexual activity: Not on file  Other Topics Concern  . Not on file  Social History Narrative  . Not on file   Social Drivers of Health   Financial Resource Strain: Not on file  Food Insecurity: Not on file  Transportation Needs: Not on file  Physical Activity: Not on file  Stress: Not on file  Social Connections: Not on file  Intimate Partner Violence: Not on file  Housing Stability: Not on file   Patient Active Problem List  Diagnosis  . Type 2 diabetes mellitus (CMS/HCC)  . Diverticulosis  . Anemia  . Complications affecting other specified body systems, hypertension  . Obesity  . Cryptogenic cirrhosis (CMS/HCC)  . End-stage renal disease (CMS/HCC)  . Gastroesophageal reflux disease  . Hypertension  . Hypothyroidism  . S/P liver transplant (CMS/HCC)  . NAFLD (nonalcoholic fatty liver disease)   Current Outpatient Medications  Medication Sig Dispense Refill  . amLODIPine  (Norvasc ) 10 MG tablet Take 1 tablet by mouth daily. 90 tablet 3  . atorvastatin  (Lipitor) 10 MG tablet Take 2 tablets by mouth daily. 180 tablet 3  . cetirizine (ZyrTEC) 5 MG tablet Take 1 tablet by mouth daily. 90 tablet 3  . cloNIDine  (Catapres ) 0.1 MG tablet Take 1 tablet by mouth 2 times daily. 180 tablet 3  . cycloSPORINE  modified (NEOral ) 25 MG capsule take 3 capsules by mouth every morning and 2 capsules every evening 450 capsule 3  . diclofenac (Voltaren) 1% topical gel Apply 2 g topically 4 times a day. 240 g 5  . ferrous sulfate (FeroSul) 325 (65 Fe) MG tablet Take 1 tablet by mouth daily with breakfast. 90 tablet 3  . gabapentin  (Neurontin ) 600 MG tablet Take 1 tablet by  mouth daily. 90 tablet 3  . insulin  glargine (Lantus  SoloStar) 100 unit/mL injection pen Inject 10 Units under the skin daily before breakfast. 9 mL 3  . insulin  lispro (HumaLOG KWIKPEN) 100 unit/mL injection pen Take 2 units with breakfast and lunch and 4 units with dinner. 16.2 mL 3  . levothyroxine  (Synthroid , Levoxyl ) 112 MCG tablet Take 1 tablet by mouth daily. 90 tablet 3  . lisinopril  (Prinivil , Zestril ) 10 MG tablet Take 1 tablet by mouth daily. 90 tablet 3  . Lumigan 0.01 % ophthalmic solution     . mycophenolate  (Myfortic ) 180 MG EC tablet take 2 tablets by mouth twice a day 360 tablet 3  . omeprazole (PriLOSEC) 20 MG DR capsule Take 20 mg by mouth daily.    SABRA PEN NEEDLES 32G X 4 MM MISC Inject insulin  per MD orders 100 each 11  . sodium bicarbonate 650 MG tablet take 1 tablet by mouth three times a day 270 tablet 3  . tirzepatide (Mounjaro) 2.5 MG/0.5ML injection pen Inject 0.5 mL (2.5 mg) under the skin every 7 days. 2 mL 0  . albuterol  108 (90 Base) MCG/ACT inhaler Inhale 2 puffs every 6 hours as needed for wheezing. 18 g 11   Current Facility-Administered Medications  Medication Dose Route Frequency Provider Last Rate Last Admin  . methylPREDNISolone  acetate (DEPO-Medrol ) injection  40 mg  40 mg Intra-lesional Once Prentice Miu, MD        The patient acknowledges and consents to being recorded for documentation purposes, including integration into the DAX system, to ensure accurate and comprehensive medical record-keeping. The patient has been made aware of the following:   The recordings and information from your medical record will be accessed and used by [employer] and [employer]'s affiliates to create the summary of your visit and to improve the performance of the AI software. [facility] has a contract in place that requires [employer] to protect your information under HIPAA.

## 2024-01-31 ENCOUNTER — Other Ambulatory Visit: Payer: Self-pay

## 2024-01-31 ENCOUNTER — Emergency Department (HOSPITAL_COMMUNITY)
Admission: EM | Admit: 2024-01-31 | Discharge: 2024-02-01 | Disposition: A | Discharge status: Home / Self Care | Payer: 59 | Attending: Emergency Medicine | Admitting: Emergency Medicine

## 2024-01-31 ENCOUNTER — Encounter (HOSPITAL_COMMUNITY): Payer: Self-pay | Admitting: Emergency Medicine

## 2024-01-31 ENCOUNTER — Emergency Department (HOSPITAL_COMMUNITY): Payer: 59

## 2024-01-31 DIAGNOSIS — Z794 Long term (current) use of insulin: Secondary | ICD-10-CM | POA: Diagnosis not present

## 2024-01-31 DIAGNOSIS — I10 Essential (primary) hypertension: Secondary | ICD-10-CM | POA: Diagnosis not present

## 2024-01-31 DIAGNOSIS — M10371 Gout due to renal impairment, right ankle and foot: Secondary | ICD-10-CM

## 2024-01-31 DIAGNOSIS — Z79899 Other long term (current) drug therapy: Secondary | ICD-10-CM | POA: Diagnosis not present

## 2024-01-31 DIAGNOSIS — M10071 Idiopathic gout, right ankle and foot: Secondary | ICD-10-CM | POA: Insufficient documentation

## 2024-01-31 DIAGNOSIS — E119 Type 2 diabetes mellitus without complications: Secondary | ICD-10-CM | POA: Insufficient documentation

## 2024-01-31 DIAGNOSIS — M7989 Other specified soft tissue disorders: Secondary | ICD-10-CM | POA: Diagnosis present

## 2024-01-31 NOTE — ED Provider Triage Note (Signed)
 Emergency Medicine Provider Triage Evaluation Note  Maryse Nofsinger , a 68 y.o. female  was evaluated in triage.  Pt complains of right great toe pain with right calf pain, swelling and redness. No open wounds. No history of DVT or Gout.  Review of Systems  Positive: Right foot pain Negative: SOB, CP  Physical Exam  BP 130/73 (BP Location: Left Arm)   Pulse (!) 58   Temp 99.1 F (37.3 C) (Oral)   Resp 16   Ht 5\' 6"  (1.676 m)   Wt 88 kg   SpO2 98%   BMI 31.31 kg/m  Gen:   Awake, no distress   Resp:  Normal effort  MSK:   Moves extremities without difficulty  Other:    Medical Decision Making  Medically screening exam initiated at 9:36 PM.  Appropriate orders placed.  Viann Deshay was informed that the remainder of the evaluation will be completed by another provider, this initial triage assessment does not replace that evaluation, and the importance of remaining in the ED until their evaluation is complete.     Adina Ahle 01/31/24 2137

## 2024-01-31 NOTE — ED Triage Notes (Addendum)
 Patient complaining of right foot pain x2 days. Denies recent injury. Redness and swelling noted to top of right foot.

## 2024-02-01 LAB — URIC ACID: Uric Acid, Serum: 10.8 mg/dL — ABNORMAL HIGH (ref 2.5–7.1)

## 2024-02-01 MED ORDER — PREDNISONE 20 MG PO TABS
60.0000 mg | ORAL_TABLET | Freq: Once | ORAL | Status: AC
Start: 2024-02-01 — End: 2024-02-01
  Administered 2024-02-01: 60 mg via ORAL
  Filled 2024-02-01: qty 3

## 2024-02-01 MED ORDER — METHYLPREDNISOLONE 4 MG PO TBPK: ORAL_TABLET | ORAL | 0 refills | Status: DC

## 2024-02-01 MED ORDER — OXYCODONE-ACETAMINOPHEN 5-325 MG PO TABS: 1.0000 | ORAL_TABLET | Freq: Four times a day (QID) | ORAL | 0 refills | Status: AC | PRN

## 2024-02-01 MED ORDER — OXYCODONE-ACETAMINOPHEN 5-325 MG PO TABS
1.0000 | ORAL_TABLET | Freq: Once | ORAL | Status: AC
  Administered 2024-02-01: 1 via ORAL
  Filled 2024-02-01: qty 1

## 2024-02-01 NOTE — Discharge Instructions (Signed)
You were seen today for toe pain.  Your history and physical exam is most consistent with gout.  This is an inflammatory arthritis.  Take medications as prescribed.  Once the flare has resolved, you should follow-up with your primary doctor regarding options for preventative treatment.  This is likely related to your kidney dysfunction.

## 2024-02-01 NOTE — ED Provider Notes (Signed)
[place] EMERGENCY DEPARTMENT AT [facility] Provider Note   CSN: [id number] Arrival date & time: 01/31/24  1902     History  Chief Complaint  Patient presents with   Foot Pain    Tammy Wilcox is a 68 y.o. female.  HPI     This is a 68 year old female who presents with pain and swelling to the right great toe and foot.  Patient states the onset of pain several days ago.  She has noted some redness.  Denies any injury.  Denies any fevers or systemic symptoms.  She has not taken anything for her pain.  No history of gout.  Denies alcohol or eating pork products.  Home Medications Prior to Admission medications   Medication Sig Start Date End Date Taking? Authorizing Provider  methylPREDNISolone (MEDROL DOSEPAK) 4 MG TBPK tablet Take as directed on package 02/01/24  Yes Nini Cavan, Mayer Masker, MD  oxyCODONE-acetaminophen (PERCOCET/ROXICET) 5-325 MG tablet Take 1 tablet by mouth every 6 (six) hours as needed for severe pain (pain score 7-10). 02/01/24  Yes Shenika Quint, Mayer Masker, MD  amLODipine (NORVASC) 10 MG tablet Take 10 mg by mouth every evening.    [provider]  atorvastatin (LIPITOR) 10 MG tablet Take 10 mg by mouth daily.    [provider]  benzonatate (TESSALON) 100 MG capsule Take 1 capsule (100 mg total) by mouth every 8 (eight) hours. 10/26/22   Carlisle Beers, FNP  bimatoprost (LUMIGAN) 0.01 % SOLN Place 1 drop into both eyes at bedtime.    [provider]  cloNIDine (CATAPRES) 0.1 MG tablet Take 0.1 mg by mouth 2 (two) times daily.    [provider]  cycloSPORINE modified (NEORAL) 25 MG capsule Take 75 mg by mouth every 12 (twelve) hours.    [provider]  DULoxetine (CYMBALTA) 30 MG capsule Take 30 mg by mouth daily.    [provider]  furosemide (LASIX) 40 MG tablet Take 40 mg by mouth daily.    [provider]  gabapentin (NEURONTIN) 600 MG tablet Take 600 mg by mouth 3 (three) times daily.  09/02/17   [provider]  Guaifenesin 1200 MG TB12 Take 1 tablet (1,200 mg total) by mouth in the morning and at bedtime. 10/26/22   Carlisle Beers, FNP  hydrALAZINE (APRESOLINE) 10 MG tablet Take 10 mg by mouth 3 (three) times daily.    [provider]  ibuprofen (ADVIL) 800 MG tablet Take 1 tablet (800 mg total) by mouth 3 (three) times daily with meals. 04/12/23   Mardella Layman, MD  insulin glargine (LANTUS) 100 UNIT/ML injection Inject 20 Units into the skin at bedtime.    [provider]  insulin lispro (HUMALOG KWIKPEN) 100 UNIT/ML KwikPen Inject 8 Units into the skin 3 (three) times daily with meals.    [provider]  levothyroxine (SYNTHROID, LEVOTHROID) 112 MCG tablet Take 112 mcg by mouth every morning.    [provider]  lidocaine (XYLOCAINE) 2 % solution Use as directed 15 mLs in the mouth or throat as needed for mouth pain. 10/06/20   Fayrene Helper, PA-C  lisinopril (PRINIVIL,ZESTRIL) 10 MG tablet Take 10 mg by mouth daily.    [provider]  loratadine (CLARITIN) 10 MG tablet Take 1 tablet (10 mg total) by mouth daily. 10/26/22   Carlisle Beers, FNP  mycophenolate (MYFORTIC) 180 MG EC tablet Take 360 mg by mouth 2 (two) times daily.    [provider]  omeprazole (PRILOSEC) 20 MG capsule Take 20 mg by mouth daily.    [provider]  ondansetron (ZOFRAN-ODT) 4 MG disintegrating tablet Take 1 tablet (4 mg total) by mouth every 8 (eight) hours as needed for nausea or vomiting. 01/10/22   Gloris Manchester, MD  tiZANidine (ZANAFLEX) 4 MG tablet Take 4 mg by mouth every 6 (six) hours.    [provider]      Allergies    Patient has no known allergies.    Review of Systems   Review of Systems  Constitutional:  Negative for fever.  Musculoskeletal:        Toe pain  Skin:  Positive for color change.  All other systems reviewed and are negative.   Physical Exam Updated Vital Signs BP (!)  180/73   Pulse (!) 52   Temp 98.9 F (37.2 C) (Oral)   Resp 18   Ht 1.676 m (5\' 6" )   Wt 88 kg   SpO2 100%   BMI 31.31 kg/m  Physical Exam Vitals and nursing note reviewed.  Constitutional:      Appearance: She is well-developed. She is not ill-appearing.  HENT:     Head: Normocephalic and atraumatic.  Eyes:     Pupils: Pupils are equal, round, and reactive to light.  Cardiovascular:     Rate and Rhythm: Normal rate and regular rhythm.  Pulmonary:     Effort: Pulmonary effort is normal. No respiratory distress.  Abdominal:     Palpations: Abdomen is soft.  Musculoskeletal:     Cervical back: Neck supple.     Comments: Tenderness to palpation right great MTP with swelling noted and slight erythema, no warmth, 2+ DP pulse No calf swelling bilaterally  Skin:    General: Skin is warm and dry.  Neurological:     Mental Status: She is alert and oriented to person, place, and time.  Psychiatric:        Mood and Affect: Mood normal.     ED Results / Procedures / Treatments   Labs (all labs ordered are listed, but only abnormal results are displayed) Labs Reviewed  URIC ACID - Abnormal; Notable for the following components:      Result Value   Uric Acid, Serum 10.8 (*)    All other components within normal limits    EKG None  Radiology DG Foot 2 Views Right Result Date: 01/31/2024 CLINICAL DATA:  Right great toe pain, swelling and erythema EXAM: RIGHT FOOT - 2 VIEW COMPARISON:  None Available. FINDINGS: No acute fracture or dislocation. No evidence of osteomyelitis. Swelling about the forefoot. IMPRESSION: No acute fracture or evidence of osteomyelitis. Swelling about the forefoot. Electronically Signed   By: Minerva Fester M.D.   On: 01/31/2024 22:56    Procedures Procedures    Medications Ordered in ED Medications  predniSONE (DELTASONE) tablet 60 mg (60 mg Oral Given 02/01/24 0327)  oxyCODONE-acetaminophen (PERCOCET/ROXICET) 5-325 MG per tablet 1 tablet (1  tablet Oral Given 02/01/24 0327)    ED Course/ Medical Decision Making/ A&P                                 Medical Decision Making Amount and/or Complexity of Data Reviewed Labs: ordered.  Risk Prescription drug management.   This patient presents to the ED for concern of right great toe pain, this involves an extensive number of treatment options, and is a complaint that  carries with it a high risk of complications and morbidity.  I considered the following differential and admission for this acute, potentially life threatening condition.  The differential diagnosis includes neuritis including inflammatory arthritis or gout, septic arthritis, occult injury  MDM:    This is a 68 year old female with a history of end-stage renal disease who presents with right great toe pain.  She is nontoxic and vital signs are notable for blood pressure of 180/73.  She is not taking her morning blood pressure medications.  Clinically, exam is most consistent with gout.  She has no significant erythema.  No systemic symptoms to suggest septic joint.  Her renal function would put her at risk for gout.  Patient was given prednisone and Percocet.  She is not a good candidate for NSAIDs.  Uric acid level is acutely elevated at 10.8.  Will discharge with a Medrol Dosepak.  Advised patient to monitor her blood sugars.  She will follow-up with her primary doctor.  (Labs, imaging, consults)  Labs: I Ordered, and personally interpreted labs.  The pertinent results include: Uric acid  Imaging Studies ordered: I ordered imaging studies including x-ray I independently visualized and interpreted imaging. I agree with the radiologist interpretation  Additional history obtained from chart review.  External records from outside source obtained and reviewed including prior evaluations  Cardiac Monitoring: The patient was not maintained on a cardiac monitor.  If on the cardiac monitor, I personally viewed and  interpreted the cardiac monitored which showed an underlying rhythm of: N/A  Reevaluation: After the interventions noted above, I reevaluated the patient and found that they have :improved  Social Determinants of Health:  lives independently  Disposition: Discharge  Co morbidities that complicate the patient evaluation  Past Medical History:  Diagnosis Date   Diabetes mellitus without complication (HCC)    Hypertension      Medicines Meds ordered this encounter  Medications   predniSONE (DELTASONE) tablet 60 mg   oxyCODONE-acetaminophen (PERCOCET/ROXICET) 5-325 MG per tablet 1 tablet    Refill:  0   methylPREDNISolone (MEDROL DOSEPAK) 4 MG TBPK tablet    Sig: Take as directed on package    Dispense:  21 tablet    Refill:  0   oxyCODONE-acetaminophen (PERCOCET/ROXICET) 5-325 MG tablet    Sig: Take 1 tablet by mouth every 6 (six) hours as needed for severe pain (pain score 7-10).    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    -  Primary   Relevant Medications   predniSONE (DELTASONE) tablet 60 mg (Completed)   oxyCODONE-acetaminophen (PERCOCET/ROXICET) 5-325 MG per tablet 1 tablet (Completed)   methylPREDNISolone (MEDROL DOSEPAK) 4 MG TBPK tablet   oxyCODONE-acetaminophen (PERCOCET/ROXICET) 5-325 MG tablet                   Final Clinical Impression(s) / ED Diagnoses Final diagnoses:  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

## 2024-03-07 ENCOUNTER — Emergency Department (HOSPITAL_COMMUNITY)
Admission: EM | Admit: 2024-03-07 | Discharge: 2024-03-08 | Disposition: A | Discharge status: Home / Self Care | Attending: Emergency Medicine | Admitting: Emergency Medicine

## 2024-03-07 DIAGNOSIS — N289 Disorder of kidney and ureter, unspecified: Secondary | ICD-10-CM | POA: Insufficient documentation

## 2024-03-07 DIAGNOSIS — R1084 Generalized abdominal pain: Secondary | ICD-10-CM | POA: Diagnosis present

## 2024-03-07 DIAGNOSIS — I700 Atherosclerosis of aorta: Secondary | ICD-10-CM | POA: Insufficient documentation

## 2024-03-07 DIAGNOSIS — R110 Nausea: Secondary | ICD-10-CM | POA: Diagnosis not present

## 2024-03-07 DIAGNOSIS — I10 Essential (primary) hypertension: Secondary | ICD-10-CM | POA: Insufficient documentation

## 2024-03-07 DIAGNOSIS — Z794 Long term (current) use of insulin: Secondary | ICD-10-CM | POA: Diagnosis not present

## 2024-03-07 DIAGNOSIS — E119 Type 2 diabetes mellitus without complications: Secondary | ICD-10-CM | POA: Insufficient documentation

## 2024-03-07 DIAGNOSIS — R109 Unspecified abdominal pain: Secondary | ICD-10-CM

## 2024-03-07 DIAGNOSIS — E1165 Type 2 diabetes mellitus with hyperglycemia: Secondary | ICD-10-CM | POA: Insufficient documentation

## 2024-03-07 DIAGNOSIS — D649 Anemia, unspecified: Secondary | ICD-10-CM | POA: Insufficient documentation

## 2024-03-07 DIAGNOSIS — Z79899 Other long term (current) drug therapy: Secondary | ICD-10-CM | POA: Insufficient documentation

## 2024-03-07 LAB — COMPREHENSIVE METABOLIC PANEL
ALT: 10 U/L (ref 0–44)
AST: 11 U/L — ABNORMAL LOW (ref 15–41)
Albumin: 3.5 g/dL (ref 3.5–5.0)
Alkaline Phosphatase: 90 U/L (ref 38–126)
Anion gap: 9 (ref 5–15)
BUN: 32 mg/dL — ABNORMAL HIGH (ref 8–23)
CO2: 17 mmol/L — ABNORMAL LOW (ref 22–32)
Calcium: 8.5 mg/dL — ABNORMAL LOW (ref 8.9–10.3)
Chloride: 110 mmol/L (ref 98–111)
Creatinine, Ser: 2.19 mg/dL — ABNORMAL HIGH (ref 0.44–1.00)
GFR, Estimated: 24 mL/min — ABNORMAL LOW (ref >60)
Glucose, Bld: 268 mg/dL — ABNORMAL HIGH (ref 70–99)
Potassium: 4.4 mmol/L (ref 3.5–5.1)
Sodium: 136 mmol/L (ref 135–145)
Total Bilirubin: 0.6 mg/dL (ref 0.0–1.2)
Total Protein: 7.5 g/dL (ref 6.5–8.1)

## 2024-03-07 LAB — CBC
HCT: 34.1 % — ABNORMAL LOW (ref 36.0–46.0)
Hemoglobin: 9.6 g/dL — ABNORMAL LOW (ref 12.0–15.0)
MCH: 21.3 pg — ABNORMAL LOW (ref 26.0–34.0)
MCHC: 28.2 g/dL — ABNORMAL LOW (ref 30.0–36.0)
MCV: 75.6 fL — ABNORMAL LOW (ref 80.0–100.0)
Platelets: 194 10*3/uL (ref 150–400)
RBC: 4.51 MIL/uL (ref 3.87–5.11)
RDW: 18 % — ABNORMAL HIGH (ref 11.5–15.5)
WBC: 6.6 10*3/uL (ref 4.0–10.5)
nRBC: 0 % (ref 0.0–0.2)

## 2024-03-07 LAB — LIPASE, BLOOD: Lipase: 33 U/L (ref 11–51)

## 2024-03-07 NOTE — ED Triage Notes (Signed)
 Pt reports oral abscess x Sunday  Pt c/o abd pain x 3 days, pt reports nausea, denies V/D. Denies sick contacts, hx of liver transplant 2013

## 2024-03-08 ENCOUNTER — Emergency Department (HOSPITAL_COMMUNITY)

## 2024-03-08 LAB — URINALYSIS, ROUTINE W REFLEX MICROSCOPIC
Bilirubin Urine: NEGATIVE
Glucose, UA: 150 mg/dL — AB
Hgb urine dipstick: NEGATIVE
Ketones, ur: NEGATIVE mg/dL
Leukocytes,Ua: NEGATIVE
Nitrite: NEGATIVE
Protein, ur: 30 mg/dL — AB
Specific Gravity, Urine: 1.011 (ref 1.005–1.030)
pH: 5 (ref 5.0–8.0)

## 2024-03-08 LAB — CBG MONITORING, ED
Glucose-Capillary: 87 mg/dL (ref 70–99)
Glucose-Capillary: 174 mg/dL — ABNORMAL HIGH (ref 70–99)
Glucose-Capillary: 59 mg/dL — ABNORMAL LOW (ref 70–99)
Glucose-Capillary: 63 mg/dL — ABNORMAL LOW (ref 70–99)
Glucose-Capillary: 145 mg/dL — ABNORMAL HIGH (ref 70–99)

## 2024-03-08 LAB — RESP PANEL BY RT-PCR (RSV, FLU A&B, COVID)  RVPGX2
Influenza A by PCR: NEGATIVE
Influenza B by PCR: NEGATIVE
Resp Syncytial Virus by PCR: NEGATIVE
SARS Coronavirus 2 by RT PCR: NEGATIVE

## 2024-03-08 MED ORDER — DEXTROSE 50 % IV SOLN
12.5000 g | Freq: Once | INTRAVENOUS | Status: AC
  Administered 2024-03-08: 12.5 g via INTRAVENOUS
  Filled 2024-03-08: qty 50

## 2024-03-08 MED ORDER — SODIUM CHLORIDE 0.9 % IV BOLUS
1000.0000 mL | Freq: Once | INTRAVENOUS | Status: AC
  Administered 2024-03-08: 1000 mL via INTRAVENOUS

## 2024-03-08 MED ORDER — SODIUM CHLORIDE 0.9% FLUSH
3.0000 mL | INTRAVENOUS | Status: DC | PRN
  Administered 2024-03-08: 3 mL via INTRAVENOUS

## 2024-03-08 MED ORDER — SODIUM CHLORIDE 0.9 % IV SOLN: 250.0000 mL | INTRAVENOUS | Status: DC | PRN

## 2024-03-08 MED ORDER — HYDROMORPHONE HCL 1 MG/ML IJ SOLN
0.5000 mg | Freq: Once | INTRAMUSCULAR | Status: AC
  Administered 2024-03-08: 0.5 mg via INTRAVENOUS
  Filled 2024-03-08: qty 1

## 2024-03-08 MED ORDER — SODIUM CHLORIDE 0.9% FLUSH
3.0000 mL | Freq: Two times a day (BID) | INTRAVENOUS | Status: DC
  Administered 2024-03-08: 3 mL via INTRAVENOUS

## 2024-03-08 MED ORDER — METOCLOPRAMIDE HCL 10 MG PO TABS: 10.0000 mg | ORAL_TABLET | Freq: Four times a day (QID) | ORAL | 0 refills | Status: DC

## 2024-03-08 MED ORDER — METOCLOPRAMIDE HCL 5 MG/ML IJ SOLN
10.0000 mg | Freq: Once | INTRAMUSCULAR | Status: AC
  Administered 2024-03-08: 10 mg via INTRAVENOUS
  Filled 2024-03-08: qty 2

## 2024-03-08 NOTE — ED Notes (Signed)
Dexcom reading 125

## 2024-03-08 NOTE — ED Notes (Signed)
 Cbg via dexcom 163. Md made aware

## 2024-03-08 NOTE — ED Notes (Signed)
 [place] PIV placed.

## 2024-03-08 NOTE — ED Notes (Signed)
 Cbg via dexcom 90, md made aware

## 2024-03-08 NOTE — ED Notes (Signed)
 Cbg via decom 150. Md made aware is okay with proceeding with dexcom readings

## 2024-03-08 NOTE — ED Notes (Signed)
 Cbg via dexcom 121. Md made aware

## 2024-03-08 NOTE — ED Provider Notes (Signed)
 [place] [facility] AT [facility] Provider Note  CSN: [id number] Arrival date & time: 03/07/24 2130  Chief Complaint(s) Abdominal Pain and Abscess  HPI Tammy Wilcox is a 68 y.o. female with a past medical history listed.  Department with generalized abdominal discomfort for 2 days.  She reports that this began after dental abscess burst open and drain.  She has been having oral pain since then as well.  Denies any overt fevers.  Endorses nausea without emesis.  Denies any diarrhea.  Denies any urinary symptoms.  No known sick contacts.  The history is provided by the patient.    Past Medical History Past Medical History:  Diagnosis Date   Diabetes mellitus without complication (HCC)    Hypertension    There are no active problems to display for this patient.  Home Medication(s) Prior to Admission medications   Medication Sig Start Date End Date Taking? Authorizing Provider  metoCLOPramide (REGLAN) 10 MG tablet Take 1 tablet (10 mg total) by mouth every 6 (six) hours. 03/08/24  Yes Vasily Fedewa, Amadeo Garnet, MD  amLODipine (NORVASC) 10 MG tablet Take 10 mg by mouth every evening.    [provider]  atorvastatin (LIPITOR) 10 MG tablet Take 10 mg by mouth daily.    [provider]  benzonatate (TESSALON) 100 MG capsule Take 1 capsule (100 mg total) by mouth every 8 (eight) hours. 10/26/22   Carlisle Beers, FNP  bimatoprost (LUMIGAN) 0.01 % SOLN Place 1 drop into both eyes at bedtime.    [provider]  cloNIDine (CATAPRES) 0.1 MG tablet Take 0.1 mg by mouth 2 (two) times daily.    [provider]  cycloSPORINE modified (NEORAL) 25 MG capsule Take 75 mg by mouth every 12 (twelve) hours.    [provider]  DULoxetine (CYMBALTA) 30 MG capsule Take 30 mg by mouth daily.    [provider]  furosemide (LASIX) 40 MG tablet Take 40 mg by mouth daily.    [provider]  gabapentin (NEURONTIN) 600 MG  tablet Take 600 mg by mouth 3 (three) times daily. 09/02/17   [provider]  Guaifenesin 1200 MG TB12 Take 1 tablet (1,200 mg total) by mouth in the morning and at bedtime. 10/26/22   Carlisle Beers, FNP  hydrALAZINE (APRESOLINE) 10 MG tablet Take 10 mg by mouth 3 (three) times daily.    [provider]  ibuprofen (ADVIL) 800 MG tablet Take 1 tablet (800 mg total) by mouth 3 (three) times daily with meals. 04/12/23   Mardella Layman, MD  insulin glargine (LANTUS) 100 UNIT/ML injection Inject 20 Units into the skin at bedtime.    [provider]  insulin lispro (HUMALOG KWIKPEN) 100 UNIT/ML KwikPen Inject 8 Units into the skin 3 (three) times daily with meals.    [provider]  levothyroxine (SYNTHROID, LEVOTHROID) 112 MCG tablet Take 112 mcg by mouth every morning.    [provider]  lidocaine (XYLOCAINE) 2 % solution Use as directed 15 mLs in the mouth or throat as needed for mouth pain. 10/06/20   Fayrene Helper, PA-C  lisinopril (PRINIVIL,ZESTRIL) 10 MG tablet Take 10 mg by mouth daily.    [provider]  loratadine (CLARITIN) 10 MG tablet Take 1 tablet (10 mg total) by mouth daily. 10/26/22   Carlisle Beers, FNP  methylPREDNISolone (MEDROL DOSEPAK) 4 MG TBPK tablet Take as directed on package 02/01/24   Horton, Mayer Masker, MD  mycophenolate (MYFORTIC) 180 MG  EC tablet Take 360 mg by mouth 2 (two) times daily.    [provider]  omeprazole (PRILOSEC) 20 MG capsule Take 20 mg by mouth daily.    [provider]  ondansetron (ZOFRAN-ODT) 4 MG disintegrating tablet Take 1 tablet (4 mg total) by mouth every 8 (eight) hours as needed for nausea or vomiting. 01/10/22   Gloris Manchester, MD  oxyCODONE-acetaminophen (PERCOCET/ROXICET) 5-325 MG tablet Take 1 tablet by mouth every 6 (six) hours as needed for severe pain (pain score 7-10). 02/01/24   Horton, Mayer Masker, MD  tiZANidine (ZANAFLEX) 4 MG tablet Take 4 mg by mouth every 6  (six) hours.    [provider]                                                                                                                                    Allergies Patient has no known allergies.  Review of Systems Review of Systems As noted in HPI  Physical Exam Vital Signs  I have reviewed the triage vital signs BP (!) 142/61   Pulse (!) 50   Temp 98.7 F (37.1 C)   Resp 19   SpO2 98%   Physical Exam Vitals reviewed.  Constitutional:      General: She is not in acute distress.    Appearance: She is well-developed. She is not diaphoretic.  HENT:     Head: Normocephalic and atraumatic.     Nose: Nose normal.     Mouth/Throat:     Dentition: Abnormal dentition. No dental abscesses.     Tongue: No lesions.     Palate: No lesions.     Pharynx: No pharyngeal swelling or posterior oropharyngeal erythema.  Eyes:     General: No scleral icterus.       Right eye: No discharge.        Left eye: No discharge.     Conjunctiva/sclera: Conjunctivae normal.     Pupils: Pupils are equal, round, and reactive to light.  Cardiovascular:     Rate and Rhythm: Normal rate and regular rhythm.     Heart sounds: No murmur heard.    No friction rub. No gallop.  Pulmonary:     Effort: Pulmonary effort is normal. No respiratory distress.     Breath sounds: Normal breath sounds. No stridor. No rales.  Abdominal:     General: A surgical scar is present. There is no distension.     Palpations: Abdomen is soft.     Tenderness: There is generalized abdominal tenderness. There is no guarding or rebound.  Musculoskeletal:        General: No tenderness.     Cervical back: Normal range of motion and neck supple.  Skin:    General: Skin is warm and dry.     Findings: No erythema or rash.  Neurological:     Mental Status: She is alert and oriented to person,  place, and time.     ED Results and Treatments Labs (all labs ordered are listed, but only abnormal results are  displayed) Labs Reviewed  COMPREHENSIVE METABOLIC PANEL - Abnormal; Notable for the following components:      Result Value   CO2 17 (*)    Glucose, Bld 268 (*)    BUN 32 (*)    Creatinine, Ser 2.19 (*)    Calcium 8.5 (*)    AST 11 (*)    GFR, Estimated 24 (*)    All other components within normal limits  CBC - Abnormal; Notable for the following components:   Hemoglobin 9.6 (*)    HCT 34.1 (*)    MCV 75.6 (*)    MCH 21.3 (*)    MCHC 28.2 (*)    RDW 18.0 (*)    All other components within normal limits  URINALYSIS, ROUTINE W REFLEX MICROSCOPIC - Abnormal; Notable for the following components:   APPearance HAZY (*)    Glucose, UA 150 (*)    Protein, ur 30 (*)    Bacteria, UA RARE (*)    All other components within normal limits  CBG MONITORING, ED - Abnormal; Notable for the following components:   Glucose-Capillary 59 (*)    All other components within normal limits  CBG MONITORING, ED - Abnormal; Notable for the following components:   Glucose-Capillary 174 (*)    All other components within normal limits  CBG MONITORING, ED - Abnormal; Notable for the following components:   Glucose-Capillary 63 (*)    All other components within normal limits  CBG MONITORING, ED - Abnormal; Notable for the following components:   Glucose-Capillary 145 (*)    All other components within normal limits  RESP PANEL BY RT-PCR (RSV, FLU A&B, COVID)  RVPGX2  LIPASE, BLOOD  CBG MONITORING, ED  CBG MONITORING, ED                                                                                                                         EKG  EKG Interpretation Date/Time:    Ventricular Rate:    PR Interval:    QRS Duration:    QT Interval:    QTC Calculation:   R Axis:      Text Interpretation:         Radiology CT Renal Stone Study Result Date: 03/08/2024 CLINICAL DATA:  Abdominal/flank pain, stone suspected EXAM: CT ABDOMEN AND PELVIS WITHOUT CONTRAST TECHNIQUE: Multidetector CT  imaging of the abdomen and pelvis was performed following the standard protocol without IV contrast. RADIATION DOSE REDUCTION: This exam was performed according to the departmental dose-optimization program which includes automated exposure control, adjustment of the mA and/or kV according to patient size and/or use of iterative reconstruction technique. COMPARISON:  CT abdomen pelvis 01/10/2022 FINDINGS: Lower chest: Bibasilar atelectasis. Hepatobiliary: Unremarkable noncontrast appearance of the liver. Cholecystectomy. No biliary dilation. Pancreas: Unremarkable. Spleen: Unremarkable. Adrenals/Urinary Tract: Normal adrenal glands. No urinary calculi or hydronephrosis.  Bladder is unremarkable. Stomach/Bowel: Normal caliber large and small bowel. No bowel wall thickening. The appendix is normal.Stomach is within normal limits. Vascular/Lymphatic: Aortic atherosclerosis. No enlarged abdominal or pelvic lymph nodes. Reproductive: Unremarkable. Other: No free intraperitoneal fluid or air. Musculoskeletal: No acute fracture. IMPRESSION: 1. No acute abnormality in the abdomen or pelvis. 2. No urinary calculi or hydronephrosis. 3. Aortic Atherosclerosis (ICD10-I70.0). Electronically Signed   By: Minerva Fester M.D.   On: 03/08/2024 02:48    Medications Ordered in ED Medications  sodium chloride flush (NS) 0.9 % injection 3 mL (3 mLs Intravenous Given 03/08/24 0436)  sodium chloride flush (NS) 0.9 % injection 3 mL (3 mLs Intravenous Given 03/08/24 0413)  0.9 %  sodium chloride infusion (has no administration in time range)  HYDROmorphone (DILAUDID) injection 0.5 mg (0.5 mg Intravenous Given 03/08/24 0132)  sodium chloride 0.9 % bolus 1,000 mL (0 mLs Intravenous Stopped 03/08/24 0436)  metoCLOPramide (REGLAN) injection 10 mg (10 mg Intravenous Given 03/08/24 0134)  dextrose 50 % solution 12.5 g (12.5 g Intravenous Given 03/08/24 0155)   Procedures Procedures  (including critical care time) Medical Decision  Making / ED Course   Medical Decision Making Amount and/or Complexity of Data Reviewed Labs: ordered. Decision-making details documented in ED Course. Radiology: ordered.  Risk Prescription drug management.    Abdominal discomfort  Differential diagnosis considered workup below.  CBC without leukocytosis.  Mild anemia with stable hemoglobin.  Metabolic panel without significant electrolyte derangements.  Patient has hyperglycemia without evidence of DKA.  Stable renal insufficiency.  No evidence of bili obstruction or pancreatitis.  CT of the abdomen negative for evidence of intra-abdominal inflammatory/infectious process or bowel obstruction.  No urinary calculi.  UA without evidence of infection. Viral panel obtained to rule out COVID, influenza, RSV which were negative.  Patient is initial blood sugar levels were elevated however they were trending down.  She did report taking her dose of Lantus last night and denies taking any excess doses.  Patient required a dose of D50.  Provided with food.  Patient monitored for several hours.  CBG stabilized.  Abd pain resolved after 1 dose of Dilaudid and Reglan. Possible gastroparesis.  Reported dental abscess. On exam, no evidence of dental abscess.  She does have diffuse dental/gingival tenderness without obvious infectious etiology. Recommend dentistry f/u      Final Clinical Impression(s) / ED Diagnoses Final diagnoses:  Abdominal discomfort   The patient appears reasonably screened and/or stabilized for discharge and I doubt any other medical condition or other [facility] requiring further screening, evaluation, or treatment in the ED at this time. I have discussed the findings, Dx and Tx plan with the patient/family who expressed understanding and agree(s) with the plan. Discharge instructions discussed at length. The patient/family was given strict return precautions who verbalized understanding of the instructions. No further questions at  time of discharge.  Disposition: Discharge  Condition: Good  ED Discharge Orders          Ordered    metoCLOPramide (REGLAN) 10 MG tablet  Every 6 hours        03/08/24 0651             Follow Up: Primary care provider  Call  to schedule an appointment for close follow up     This chart was dictated using voice recognition software.  Despite best efforts to proofread,  errors can occur which can change the documentation meaning.    Nira Conn, MD 03/08/24 [phone]

## 2024-03-08 NOTE — ED Notes (Signed)
 Pt given ham sandwich, cheese, and coke

## 2024-05-04 ENCOUNTER — Encounter (HOSPITAL_COMMUNITY): Payer: Self-pay

## 2024-05-04 ENCOUNTER — Emergency Department (HOSPITAL_COMMUNITY)
Admission: EM | Admit: 2024-05-04 | Discharge: 2024-05-05 | Disposition: A | Discharge status: Home / Self Care | Attending: Emergency Medicine | Admitting: Emergency Medicine

## 2024-05-04 ENCOUNTER — Emergency Department (HOSPITAL_COMMUNITY)

## 2024-05-04 ENCOUNTER — Other Ambulatory Visit: Payer: Self-pay

## 2024-05-04 DIAGNOSIS — I129 Hypertensive chronic kidney disease with stage 1 through stage 4 chronic kidney disease, or unspecified chronic kidney disease: Secondary | ICD-10-CM | POA: Diagnosis not present

## 2024-05-04 DIAGNOSIS — Z79899 Other long term (current) drug therapy: Secondary | ICD-10-CM | POA: Diagnosis not present

## 2024-05-04 DIAGNOSIS — E1122 Type 2 diabetes mellitus with diabetic chronic kidney disease: Secondary | ICD-10-CM | POA: Insufficient documentation

## 2024-05-04 DIAGNOSIS — J189 Pneumonia, unspecified organism: Secondary | ICD-10-CM | POA: Diagnosis not present

## 2024-05-04 DIAGNOSIS — R0602 Shortness of breath: Secondary | ICD-10-CM | POA: Diagnosis present

## 2024-05-04 DIAGNOSIS — Z794 Long term (current) use of insulin: Secondary | ICD-10-CM | POA: Diagnosis not present

## 2024-05-04 DIAGNOSIS — J4521 Mild intermittent asthma with (acute) exacerbation: Secondary | ICD-10-CM | POA: Diagnosis not present

## 2024-05-04 DIAGNOSIS — N189 Chronic kidney disease, unspecified: Secondary | ICD-10-CM | POA: Insufficient documentation

## 2024-05-04 LAB — CBC
HCT: 32.7 % — ABNORMAL LOW (ref 36.0–46.0)
Hemoglobin: 9.3 g/dL — ABNORMAL LOW (ref 12.0–15.0)
MCH: 21.7 pg — ABNORMAL LOW (ref 26.0–34.0)
MCHC: 28.4 g/dL — ABNORMAL LOW (ref 30.0–36.0)
MCV: 76.4 fL — ABNORMAL LOW (ref 80.0–100.0)
Platelets: 200 10*3/uL (ref 150–400)
RBC: 4.28 MIL/uL (ref 3.87–5.11)
RDW: 17.8 % — ABNORMAL HIGH (ref 11.5–15.5)
WBC: 7.2 10*3/uL (ref 4.0–10.5)
nRBC: 0 % (ref 0.0–0.2)

## 2024-05-04 LAB — BASIC METABOLIC PANEL WITH GFR
Anion gap: 8 (ref 5–15)
BUN: 25 mg/dL — ABNORMAL HIGH (ref 8–23)
CO2: 19 mmol/L — ABNORMAL LOW (ref 22–32)
Calcium: 8.4 mg/dL — ABNORMAL LOW (ref 8.9–10.3)
Chloride: 112 mmol/L — ABNORMAL HIGH (ref 98–111)
Creatinine, Ser: 2.51 mg/dL — ABNORMAL HIGH (ref 0.44–1.00)
GFR, Estimated: 20 mL/min — ABNORMAL LOW (ref >60)
Glucose, Bld: 202 mg/dL — ABNORMAL HIGH (ref 70–99)
Potassium: 5 mmol/L (ref 3.5–5.1)
Sodium: 139 mmol/L (ref 135–145)

## 2024-05-04 LAB — BRAIN NATRIURETIC PEPTIDE: B Natriuretic Peptide: 170.9 pg/mL — ABNORMAL HIGH (ref 0.0–100.0)

## 2024-05-04 LAB — I-STAT CG4 LACTIC ACID, ED: Lactic Acid, Venous: 1.1 mmol/L (ref 0.5–1.9)

## 2024-05-04 MED ORDER — IPRATROPIUM-ALBUTEROL 0.5-2.5 (3) MG/3ML IN SOLN
3.0000 mL | Freq: Once | RESPIRATORY_TRACT | Status: AC
  Administered 2024-05-05: 3 mL via RESPIRATORY_TRACT
  Filled 2024-05-04: qty 3

## 2024-05-04 MED ORDER — DOXYCYCLINE HYCLATE 100 MG PO TABS
100.0000 mg | ORAL_TABLET | Freq: Once | ORAL | Status: AC
  Administered 2024-05-05: 100 mg via ORAL
  Filled 2024-05-04: qty 1

## 2024-05-04 MED ORDER — AMOXICILLIN-POT CLAVULANATE 500-125 MG PO TABS
1.0000 | ORAL_TABLET | Freq: Two times a day (BID) | ORAL | 0 refills | Status: AC
Start: 2024-05-04 — End: 2024-05-11

## 2024-05-04 MED ORDER — BENZONATATE 100 MG PO CAPS: 100.0000 mg | ORAL_CAPSULE | Freq: Three times a day (TID) | ORAL | 0 refills | Status: AC | PRN

## 2024-05-04 MED ORDER — MAGNESIUM SULFATE 2 GM/50ML IV SOLN
2.0000 g | Freq: Once | INTRAVENOUS | Status: AC
  Administered 2024-05-04: 2 g via INTRAVENOUS
  Filled 2024-05-04: qty 50

## 2024-05-04 MED ORDER — ALBUTEROL SULFATE HFA 108 (90 BASE) MCG/ACT IN AERS: 1.0000 | INHALATION_SPRAY | Freq: Four times a day (QID) | RESPIRATORY_TRACT | 0 refills | Status: AC | PRN

## 2024-05-04 MED ORDER — ALBUTEROL SULFATE HFA 108 (90 BASE) MCG/ACT IN AERS
1.0000 | INHALATION_SPRAY | Freq: Once | RESPIRATORY_TRACT | Status: AC
  Administered 2024-05-05: 1 via RESPIRATORY_TRACT
  Filled 2024-05-04: qty 6.7

## 2024-05-04 MED ORDER — METHYLPREDNISOLONE SODIUM SUCC 125 MG IJ SOLR
125.0000 mg | Freq: Once | INTRAMUSCULAR | Status: AC
  Administered 2024-05-04: 125 mg via INTRAVENOUS
  Filled 2024-05-04: qty 2

## 2024-05-04 MED ORDER — PREDNISONE 50 MG PO TABS: 50.0000 mg | ORAL_TABLET | Freq: Every day | ORAL | 0 refills | Status: AC

## 2024-05-04 MED ORDER — IPRATROPIUM-ALBUTEROL 0.5-2.5 (3) MG/3ML IN SOLN
3.0000 mL | Freq: Once | RESPIRATORY_TRACT | Status: AC
  Administered 2024-05-04: 3 mL via RESPIRATORY_TRACT
  Filled 2024-05-04: qty 3

## 2024-05-04 MED ORDER — DOXYCYCLINE HYCLATE 100 MG PO CAPS: 100.0000 mg | ORAL_CAPSULE | Freq: Two times a day (BID) | ORAL | 0 refills | Status: DC

## 2024-05-04 MED ORDER — AMOXICILLIN-POT CLAVULANATE 500-125 MG PO TABS
1.0000 | ORAL_TABLET | Freq: Once | ORAL | Status: AC
  Administered 2024-05-05: 1 via ORAL
  Filled 2024-05-04: qty 1

## 2024-05-04 NOTE — ED Provider Notes (Signed)
  EMERGENCY DEPARTMENT AT [facility] Provider Note  CSN: [id number] Arrival date & time: 05/04/24 2051  Chief Complaint(s) Shortness of Breath and Cough  HPI Tammy Wilcox is a 68 y.o. female history of diabetes, asthma, liver transplant, hypertension, CKD presenting to the emergency department with shortness of breath.  Patient reports 4 days of worsening symptoms.  Worse with exertion.  No chest pain, abdominal pain.  She reports some runny nose and congestion as well.  She reports dry cough.  Has been wheezing.  No sick contacts.  No fevers or chills.  No leg swelling.   Past Medical History Past Medical History:  Diagnosis Date   Diabetes mellitus without complication (HCC)    Hypertension    There are no active problems to display for this patient.  Home Medication(s) Prior to Admission medications   Medication Sig Start Date End Date Taking? Authorizing Provider  amLODipine  (NORVASC ) 10 MG tablet Take 10 mg by mouth every evening.    [provider]  atorvastatin (LIPITOR) 10 MG tablet Take 10 mg by mouth daily.    [provider]  benzonatate  (TESSALON ) 100 MG capsule Take 1 capsule (100 mg total) by mouth every 8 (eight) hours. 10/26/22   Starlene Eaton, FNP  bimatoprost (LUMIGAN) 0.01 % SOLN Place 1 drop into both eyes at bedtime.    [provider]  cloNIDine (CATAPRES) 0.1 MG tablet Take 0.1 mg by mouth 2 (two) times daily.    [provider]  cycloSPORINE modified (NEORAL) 25 MG capsule Take 75 mg by mouth every 12 (twelve) hours.    [provider]  DULoxetine (CYMBALTA) 30 MG capsule Take 30 mg by mouth daily.    [provider]  furosemide (LASIX) 40 MG tablet Take 40 mg by mouth daily.    [provider]  gabapentin (NEURONTIN) 600 MG tablet Take 600 mg by mouth 3 (three) times daily. 09/02/17   [provider]  Guaifenesin  1200 MG TB12 Take 1 tablet (1,200 mg total) by  mouth in the morning and at bedtime. 10/26/22   Starlene Eaton, FNP  hydrALAZINE (APRESOLINE) 10 MG tablet Take 10 mg by mouth 3 (three) times daily.    [provider]  ibuprofen  (ADVIL ) 800 MG tablet Take 1 tablet (800 mg total) by mouth 3 (three) times daily with meals. 04/12/23   Afton Albright, MD  insulin glargine (LANTUS) 100 UNIT/ML injection Inject 20 Units into the skin at bedtime.    [provider]  insulin lispro (HUMALOG KWIKPEN) 100 UNIT/ML KwikPen Inject 8 Units into the skin 3 (three) times daily with meals.    [provider]  levothyroxine (SYNTHROID, LEVOTHROID) 112 MCG tablet Take 112 mcg by mouth every morning.    [provider]  lidocaine  (XYLOCAINE ) 2 % solution Use as directed 15 mLs in the mouth or throat as needed for mouth pain. 10/06/20   Debbra Fairy, PA-C  lisinopril  (PRINIVIL ,ZESTRIL ) 10 MG tablet Take 10 mg by mouth daily.    [provider]  loratadine  (CLARITIN ) 10 MG tablet Take 1 tablet (10 mg total) by mouth daily. 10/26/22   Starlene Eaton, FNP  methylPREDNISolone  (MEDROL  DOSEPAK) 4 MG TBPK tablet Take as directed on package 02/01/24   Horton, Vonzella Guernsey, MD  metoCLOPramide  (REGLAN ) 10 MG tablet Take 1 tablet (10 mg total) by mouth every 6 (six) hours. 03/08/24   Lindle Rhea, MD  mycophenolate (MYFORTIC) 180 MG EC tablet Take 360  mg by mouth 2 (two) times daily.    [provider]  omeprazole (PRILOSEC) 20 MG capsule Take 20 mg by mouth daily.    [provider]  ondansetron  (ZOFRAN -ODT) 4 MG disintegrating tablet Take 1 tablet (4 mg total) by mouth every 8 (eight) hours as needed for nausea or vomiting. 01/10/22   Iva Mariner, MD  oxyCODONE -acetaminophen  (PERCOCET/ROXICET) 5-325 MG tablet Take 1 tablet by mouth every 6 (six) hours as needed for severe pain (pain score 7-10). 02/01/24   Horton, Vonzella Guernsey, MD  tiZANidine (ZANAFLEX) 4 MG tablet Take 4 mg by mouth every 6 (six) hours.     [provider]                                                                                                                                    Past Surgical History Past Surgical History:  Procedure Laterality Date   liver transplant     Family History History reviewed. No pertinent family history.  Social History Social History   Tobacco Use   Smoking status: Unknown   Smokeless tobacco: Never  Substance Use Topics   Alcohol use: Never   Drug use: Never   Allergies Patient has no known allergies.  Review of Systems Review of Systems  All other systems reviewed and are negative.   Physical Exam Vital Signs  I have reviewed the triage vital signs BP 132/75   Pulse (!) 52   Temp 98.8 F (37.1 C) (Oral)   Resp (!) 26   Ht 5\' 6"  (1.676 m)   Wt 88 kg   SpO2 99%   BMI 31.31 kg/m  Physical Exam Vitals and nursing note reviewed.  Constitutional:      General: She is not in acute distress.    Appearance: She is well-developed.  HENT:     Head: Normocephalic and atraumatic.     Mouth/Throat:     Mouth: Mucous membranes are moist.  Eyes:     Pupils: Pupils are equal, round, and reactive to light.  Cardiovascular:     Rate and Rhythm: Normal rate and regular rhythm.     Heart sounds: No murmur heard. Pulmonary:     Effort: Pulmonary effort is normal. Tachypnea present. No respiratory distress.     Breath sounds: Examination of the right-upper field reveals wheezing. Examination of the left-upper field reveals wheezing. Examination of the right-middle field reveals wheezing. Examination of the left-middle field reveals wheezing. Examination of the right-lower field reveals wheezing. Examination of the left-lower field reveals wheezing. Wheezing present.  Abdominal:     General: Abdomen is flat.     Palpations: Abdomen is soft.     Tenderness: There is no abdominal tenderness.  Musculoskeletal:        General: No tenderness.     Right lower leg: No  edema.     Left lower leg: No edema.  Skin:  General: Skin is warm and dry.  Neurological:     General: No focal deficit present.     Mental Status: She is alert. Mental status is at baseline.  Psychiatric:        Mood and Affect: Mood normal.        Behavior: Behavior normal.     ED Results and Treatments Labs (all labs ordered are listed, but only abnormal results are displayed) Labs Reviewed  BASIC METABOLIC PANEL WITH GFR - Abnormal; Notable for the following components:      Result Value   Chloride 112 (*)    CO2 19 (*)    Glucose, Bld 202 (*)    BUN 25 (*)    Creatinine, Ser 2.51 (*)    Calcium 8.4 (*)    GFR, Estimated 20 (*)    All other components within normal limits  CBC - Abnormal; Notable for the following components:   Hemoglobin 9.3 (*)    HCT 32.7 (*)    MCV 76.4 (*)    MCH 21.7 (*)    MCHC 28.4 (*)    RDW 17.8 (*)    All other components within normal limits  BRAIN NATRIURETIC PEPTIDE - Abnormal; Notable for the following components:   B Natriuretic Peptide 170.9 (*)    All other components within normal limits  I-STAT CHEM 8, ED  I-STAT CG4 LACTIC ACID, ED                                                                                                                          Radiology DG Chest Port 1 View Result Date: 05/04/2024 CLINICAL DATA:  Short of breath for 4 days EXAM: PORTABLE CHEST 1 VIEW COMPARISON:  10/25/2022 FINDINGS: Single frontal view of the chest demonstrates a stable cardiac silhouette. Lung volumes are diminished, with linear areas of consolidation at the right lung base consistent with subsegmental atelectasis. Left lower lobe consolidation could reflect atelectasis or airspace disease. No effusion or pneumothorax. No acute bony abnormalities. IMPRESSION: 1. Low lung volumes, with subsegmental atelectasis at the right lung base. 2. Patchy left basilar consolidation which could reflect airspace disease or atelectasis. Electronically  Signed   By: Bobbye Burrow M.D.   On: 05/04/2024 22:12    Pertinent labs & imaging results that were available during my care of the patient were reviewed by me and considered in my medical decision making (see MDM for details).  Medications Ordered in ED Medications  magnesium  sulfate IVPB 2 g 50 mL (2 g Intravenous New Bag/Given 05/04/24 2243)  ipratropium-albuterol  (DUONEB) 0.5-2.5 (3) MG/3ML nebulizer solution 3 mL (3 mLs Nebulization Given 05/04/24 2244)  methylPREDNISolone  sodium succinate (SOLU-MEDROL ) 125 mg/2 mL injection 125 mg (125 mg Intravenous Given 05/04/24 2244)  Procedures Procedures  (including critical care time)  Medical Decision Making / ED Course   MDM:  68 year old presented to the emergency department with wheezing.  Patient overall well-appearing, does have some tachypnea, and exam with diffuse wheezing.  Suspect asthma exacerbation.  Will give DuoNeb, steroids, magnesium .  Will reassess.  Lower concern for CHF.  No focal pulmonary findings on exam.  Labs without leukocytosis.  Chest x-ray with some possible basilar consolidation.  Given immunosuppressed state with liver transplant probably will cover empirically with antibiotics.  Will reassess.      Additional history obtained: -Additional history obtained from {wsadditionalhistorian:28072} -External records from outside source obtained and reviewed including: Chart review including previous notes, labs, imaging, consultation notes including ***   Lab Tests: -I ordered, reviewed, and interpreted labs.   The pertinent results include:   Labs Reviewed  BASIC METABOLIC PANEL WITH GFR - Abnormal; Notable for the following components:      Result Value   Chloride 112 (*)    CO2 19 (*)    Glucose, Bld 202 (*)    BUN 25 (*)    Creatinine, Ser 2.51 (*)    Calcium 8.4 (*)     GFR, Estimated 20 (*)    All other components within normal limits  CBC - Abnormal; Notable for the following components:   Hemoglobin 9.3 (*)    HCT 32.7 (*)    MCV 76.4 (*)    MCH 21.7 (*)    MCHC 28.4 (*)    RDW 17.8 (*)    All other components within normal limits  BRAIN NATRIURETIC PEPTIDE - Abnormal; Notable for the following components:   B Natriuretic Peptide 170.9 (*)    All other components within normal limits  I-STAT CHEM 8, ED  I-STAT CG4 LACTIC ACID, ED    Notable for ***  EKG   EKG Interpretation Date/Time:  Thursday May 04 2024 21:35:54 EDT Ventricular Rate:  59 PR Interval:  139 QRS Duration:  92 QT Interval:  417 QTC Calculation: 414 R Axis:   -2  Text Interpretation: Sinus rhythm Ventricular premature complex Low voltage, precordial leads LVH by voltage Confirmed by Abner Hoffman [phone]) on 05/04/2024 10:35:28 PM         Imaging Studies ordered: I ordered imaging studies including *** On my interpretation imaging demonstrates *** I independently visualized and interpreted imaging. I agree with the radiologist interpretation   Medicines ordered and prescription drug management: Meds ordered this encounter  Medications   ipratropium-albuterol  (DUONEB) 0.5-2.5 (3) MG/3ML nebulizer solution 3 mL   methylPREDNISolone  sodium succinate (SOLU-MEDROL ) 125 mg/2 mL injection 125 mg    IV methylprednisolone  will be converted to either a q12h or q24h frequency with the same total daily dose (TDD).  Ordered Dose: 1 to 125 mg TDD; convert to: TDD q24h.  Ordered Dose: 126 to 250 mg TDD; convert to: TDD div q12h.  Ordered Dose: >250 mg TDD; DAW.   magnesium  sulfate IVPB 2 g 50 mL    -I have reviewed the patients home medicines and have made adjustments as needed   Consultations Obtained: I requested consultation with the ***,  and discussed lab and imaging findings as well as pertinent plan - they recommend: ***   Cardiac Monitoring: The patient was  maintained on a cardiac monitor.  I personally viewed and interpreted the cardiac monitored which showed an underlying rhythm of: ***  Social Determinants of Health:  Diagnosis or treatment significantly limited by social determinants of health: {  wssoc:28071}   Reevaluation: After the interventions noted above, I reevaluated the patient and found that their symptoms have {resolved/improved/worsened:23923::"improved"}  Co morbidities that complicate the patient evaluation  Past Medical History:  Diagnosis Date   Diabetes mellitus without complication (HCC)    Hypertension       Dispostion: Disposition decision including need for hospitalization was considered, and patient {wsdispo:28070::"discharged from emergency department."}    Final Clinical Impression(s) / ED Diagnoses Final diagnoses:  None     This chart was dictated using voice recognition software.  Despite best efforts to proofread,  errors can occur which can change the documentation meaning.

## 2024-05-04 NOTE — Discharge Instructions (Signed)
 We evaluated you for your shortness of breath.  Your symptoms are due to an asthma exacerbation.  Your symptoms improved in the emergency department with treatment.  Your x-ray did show findings concerning for a possible pneumonia.  We have treated you with antibiotics.  We have also given you a prescription for steroids.  We have refilled your inhaler.  Please follow-up closely with your primary physician.  If you have any worsening symptoms such as worsening shortness of breath, lightheadedness or dizziness, chest pain, abdominal pain or back pain, or any other new symptoms, please return to the emergency department.

## 2024-05-04 NOTE — ED Triage Notes (Signed)
 Pt has c/o [facility] for 4 days that's getting worse. Pt stating that she can't breathe, and worsens with exertion

## 2024-10-17 ENCOUNTER — Emergency Department (HOSPITAL_COMMUNITY)

## 2024-10-17 ENCOUNTER — Other Ambulatory Visit: Payer: Self-pay

## 2024-10-17 ENCOUNTER — Emergency Department (HOSPITAL_COMMUNITY): Admission: EM | Admit: 2024-10-17 | Discharge: 2024-10-17 | Disposition: A | Discharge status: Home / Self Care

## 2024-10-17 ENCOUNTER — Encounter (HOSPITAL_COMMUNITY): Payer: Self-pay | Admitting: Emergency Medicine

## 2024-10-17 DIAGNOSIS — R197 Diarrhea, unspecified: Secondary | ICD-10-CM | POA: Diagnosis not present

## 2024-10-17 DIAGNOSIS — R1110 Vomiting, unspecified: Secondary | ICD-10-CM

## 2024-10-17 DIAGNOSIS — Z794 Long term (current) use of insulin: Secondary | ICD-10-CM | POA: Diagnosis not present

## 2024-10-17 DIAGNOSIS — R1011 Right upper quadrant pain: Secondary | ICD-10-CM | POA: Insufficient documentation

## 2024-10-17 DIAGNOSIS — J45909 Unspecified asthma, uncomplicated: Secondary | ICD-10-CM | POA: Diagnosis not present

## 2024-10-17 DIAGNOSIS — R109 Unspecified abdominal pain: Secondary | ICD-10-CM

## 2024-10-17 DIAGNOSIS — R112 Nausea with vomiting, unspecified: Secondary | ICD-10-CM | POA: Diagnosis not present

## 2024-10-17 HISTORY — DX: Disorder of thyroid, unspecified: E07.9

## 2024-10-17 LAB — HEPATIC FUNCTION PANEL
ALT: 7 U/L (ref 0–44)
AST: 18 U/L (ref 15–41)
Albumin: 4 g/dL (ref 3.5–5.0)
Alkaline Phosphatase: 131 U/L — ABNORMAL HIGH (ref 38–126)
Bilirubin, Direct: <0.1 mg/dL (ref 0.0–0.2)
Total Bilirubin: 0.4 mg/dL (ref 0.0–1.2)
Total Protein: 8.1 g/dL (ref 6.5–8.1)

## 2024-10-17 LAB — BASIC METABOLIC PANEL WITH GFR
Anion gap: 12 (ref 5–15)
BUN: 37 mg/dL — ABNORMAL HIGH (ref 8–23)
CO2: 19 mmol/L — ABNORMAL LOW (ref 22–32)
Calcium: 9.5 mg/dL (ref 8.9–10.3)
Chloride: 103 mmol/L (ref 98–111)
Creatinine, Ser: 2.87 mg/dL — ABNORMAL HIGH (ref 0.44–1.00)
GFR, Estimated: 17 mL/min — ABNORMAL LOW (ref >60)
Glucose, Bld: 351 mg/dL — ABNORMAL HIGH (ref 70–99)
Potassium: 4.9 mmol/L (ref 3.5–5.1)
Sodium: 134 mmol/L — ABNORMAL LOW (ref 135–145)

## 2024-10-17 LAB — CBC WITH DIFFERENTIAL/PLATELET
Abs Immature Granulocytes: 0.01 K/uL (ref 0.00–0.07)
Basophils Absolute: 0 K/uL (ref 0.0–0.1)
Basophils Relative: 0 %
Eosinophils Absolute: 0.1 K/uL (ref 0.0–0.5)
Eosinophils Relative: 1 %
HCT: 30.8 % — ABNORMAL LOW (ref 36.0–46.0)
Hemoglobin: 8.9 g/dL — ABNORMAL LOW (ref 12.0–15.0)
Immature Granulocytes: 0 %
Lymphocytes Relative: 19 %
Lymphs Abs: 0.9 K/uL (ref 0.7–4.0)
MCH: 21.3 pg — ABNORMAL LOW (ref 26.0–34.0)
MCHC: 28.9 g/dL — ABNORMAL LOW (ref 30.0–36.0)
MCV: 73.7 fL — ABNORMAL LOW (ref 80.0–100.0)
Monocytes Absolute: 0.4 K/uL (ref 0.1–1.0)
Monocytes Relative: 8 %
Neutro Abs: 3.4 K/uL (ref 1.7–7.7)
Neutrophils Relative %: 72 %
Platelets: 157 K/uL (ref 150–400)
RBC: 4.18 MIL/uL (ref 3.87–5.11)
RDW: 16.5 % — ABNORMAL HIGH (ref 11.5–15.5)
WBC: 4.8 K/uL (ref 4.0–10.5)
nRBC: 0 % (ref 0.0–0.2)

## 2024-10-17 LAB — LIPASE, BLOOD: Lipase: 75 U/L — ABNORMAL HIGH (ref 11–51)

## 2024-10-17 MED ORDER — ONDANSETRON HCL 4 MG PO TABS: 4.0000 mg | ORAL_TABLET | Freq: Three times a day (TID) | ORAL | 0 refills | Status: DC | PRN

## 2024-10-17 MED ORDER — ONDANSETRON HCL 4 MG PO TABS: 4.0000 mg | ORAL_TABLET | Freq: Three times a day (TID) | ORAL | 0 refills | Status: AC | PRN

## 2024-10-17 MED ORDER — LACTATED RINGERS IV BOLUS
500.0000 mL | Freq: Once | INTRAVENOUS | Status: AC
  Administered 2024-10-17: 500 mL via INTRAVENOUS

## 2024-10-17 MED ORDER — HYDROMORPHONE HCL 1 MG/ML IJ SOLN
1.0000 mg | Freq: Once | INTRAMUSCULAR | Status: AC
Start: 2024-10-17 — End: 2024-10-17
  Administered 2024-10-17: 1 mg via INTRAMUSCULAR
  Filled 2024-10-17: qty 1

## 2024-10-17 MED ORDER — ONDANSETRON HCL 4 MG/2ML IJ SOLN
4.0000 mg | Freq: Once | INTRAMUSCULAR | Status: AC
  Administered 2024-10-17: 4 mg via INTRAVENOUS
  Filled 2024-10-17: qty 2

## 2024-10-17 NOTE — ED Provider Notes (Signed)
 [place] EMERGENCY DEPARTMENT AT [facility] Provider Note   CSN: [id number] Arrival date & time: 10/17/24  0945     Patient presents with: Abdominal Pain   Jackquelyn Turan is a 68 y.o. female.   68 year old female presents for evaluation of right upper quadrant abdominal pain.  She has a history of liver transplant.  States she has been nauseous and vomiting as well and this started yesterday.  She denies any other symptoms or concerns.   Abdominal Pain Associated symptoms: nausea and vomiting   Associated symptoms: no chest pain, no chills, no cough, no dysuria, no fever, no hematuria, no shortness of breath and no sore throat        Prior to Admission medications   Medication Sig Start Date End Date Taking? Authorizing Provider  albuterol  (VENTOLIN  HFA) 108 (90 Base) MCG/ACT inhaler Inhale 1-2 puffs into the lungs every 6 (six) hours as needed for wheezing or shortness of breath. 05/04/24   Francesca Elsie CROME, MD  amLODipine  (NORVASC ) 10 MG tablet Take 10 mg by mouth every evening.    [provider]  atorvastatin (LIPITOR) 10 MG tablet Take 10 mg by mouth daily.    [provider]  benzonatate  (TESSALON ) 100 MG capsule Take 1 capsule (100 mg total) by mouth 3 (three) times daily as needed for cough. 05/04/24   Francesca Elsie CROME, MD  bimatoprost (LUMIGAN) 0.01 % SOLN Place 1 drop into both eyes at bedtime.    [provider]  cloNIDine (CATAPRES) 0.1 MG tablet Take 0.1 mg by mouth 2 (two) times daily.    [provider]  cycloSPORINE modified (NEORAL) 25 MG capsule Take 75 mg by mouth every 12 (twelve) hours.    [provider]  doxycycline  (VIBRAMYCIN ) 100 MG capsule Take 1 capsule (100 mg total) by mouth 2 (two) times daily. 05/04/24   Francesca Elsie CROME, MD  DULoxetine (CYMBALTA) 30 MG capsule Take 30 mg by mouth daily.    [provider]  furosemide (LASIX) 40 MG tablet Take 40 mg by mouth daily.    [provider]  gabapentin (NEURONTIN) 600 MG tablet Take 600 mg by mouth 3 (three) times daily. 09/02/17   [provider]  Guaifenesin  1200 MG TB12 Take 1 tablet (1,200 mg total) by mouth in the morning and at bedtime. 10/26/22   Enedelia Dorna HERO, FNP  hydrALAZINE (APRESOLINE) 10 MG tablet Take 10 mg by mouth 3 (three) times daily.    [provider]  ibuprofen  (ADVIL ) 800 MG tablet Take 1 tablet (800 mg total) by mouth 3 (three) times daily with meals. 04/12/23   Rolinda Rogue, MD  insulin glargine (LANTUS) 100 UNIT/ML injection Inject 20 Units into the skin at bedtime.    [provider]  insulin lispro (HUMALOG KWIKPEN) 100 UNIT/ML KwikPen Inject 8 Units into the skin 3 (three) times daily with meals.    [provider]  levothyroxine (SYNTHROID, LEVOTHROID) 112 MCG tablet Take 112 mcg by mouth every morning.    [provider]  lidocaine  (XYLOCAINE ) 2 % solution Use as directed 15 mLs in the mouth or throat as needed for mouth pain. 10/06/20   Nivia Colon, PA-C  lisinopril  (PRINIVIL ,ZESTRIL ) 10 MG tablet Take 10 mg by mouth daily.    [provider]  loratadine  (CLARITIN ) 10 MG tablet Take 1 tablet (10 mg total) by mouth daily. 10/26/22   Enedelia Dorna HERO, FNP  metoCLOPramide  (REGLAN ) 10 MG tablet Take 1 tablet (  10 mg total) by mouth every 6 (six) hours. 03/08/24   Trine Raynell Moder, MD  mycophenolate (MYFORTIC) 180 MG EC tablet Take 360 mg by mouth 2 (two) times daily.    [provider]  omeprazole (PRILOSEC) 20 MG capsule Take 20 mg by mouth daily.    [provider]  ondansetron  (ZOFRAN ) 4 MG tablet Take 1 tablet (4 mg total) by mouth every 8 (eight) hours as needed for up to 4 days. 10/17/24 10/21/24  Jaleeyah Munce, Duwaine L, DO  ondansetron  (ZOFRAN -ODT) 4 MG disintegrating tablet Take 1 tablet (4 mg total) by mouth every 8 (eight) hours as needed for nausea or vomiting. 01/10/22   Melvenia Motto, MD   oxyCODONE -acetaminophen  (PERCOCET/ROXICET) 5-325 MG tablet Take 1 tablet by mouth every 6 (six) hours as needed for severe pain (pain score 7-10). 02/01/24   Horton, Charmaine FALCON, MD  tiZANidine (ZANAFLEX) 4 MG tablet Take 4 mg by mouth every 6 (six) hours.    [provider]    Allergies: Patient has no known allergies.    Review of Systems  Constitutional:  Negative for chills and fever.  HENT:  Negative for ear pain and sore throat.   Eyes:  Negative for pain and visual disturbance.  Respiratory:  Negative for cough and shortness of breath.   Cardiovascular:  Negative for chest pain and palpitations.  Gastrointestinal:  Positive for abdominal pain, nausea and vomiting.  Genitourinary:  Negative for dysuria and hematuria.  Musculoskeletal:  Negative for arthralgias and back pain.  Skin:  Negative for color change and rash.  Neurological:  Negative for seizures and syncope.  All other systems reviewed and are negative.   Updated Vital Signs BP (!) 140/53 (BP Location: Left Arm)   Pulse (!) 51   Temp (!) 97.4 F (36.3 C)   Resp 18   SpO2 99%   Physical Exam Vitals and nursing note reviewed.  Constitutional:      General: She is not in acute distress.    Appearance: She is well-developed. She is ill-appearing.  HENT:     Head: Normocephalic and atraumatic.  Eyes:     Conjunctiva/sclera: Conjunctivae normal.  Cardiovascular:     Rate and Rhythm: Normal rate and regular rhythm.     Heart sounds: No murmur heard. Pulmonary:     Effort: Pulmonary effort is normal. No respiratory distress.     Breath sounds: Normal breath sounds.  Abdominal:     Palpations: Abdomen is soft.     Tenderness: There is abdominal tenderness in the right upper quadrant.  Musculoskeletal:        General: No swelling.     Cervical back: Neck supple.  Skin:    General: Skin is warm and dry.     Capillary Refill: Capillary refill takes less than 2 seconds.  Neurological:     Mental  Status: She is alert.  Psychiatric:        Mood and Affect: Mood normal.     (all labs ordered are listed, but only abnormal results are displayed) Labs Reviewed  BASIC METABOLIC PANEL WITH GFR - Abnormal; Notable for the following components:      Result Value   Sodium 134 (*)    CO2 19 (*)    Glucose, Bld 351 (*)    BUN 37 (*)    Creatinine, Ser 2.87 (*)    GFR, Estimated 17 (*)    All other components within normal limits  HEPATIC FUNCTION PANEL - Abnormal;  Notable for the following components:   Alkaline Phosphatase 131 (*)    All other components within normal limits  LIPASE, BLOOD - Abnormal; Notable for the following components:   Lipase 75 (*)    All other components within normal limits  CBC WITH DIFFERENTIAL/PLATELET - Abnormal; Notable for the following components:   Hemoglobin 8.9 (*)    HCT 30.8 (*)    MCV 73.7 (*)    MCH 21.3 (*)    MCHC 28.9 (*)    RDW 16.5 (*)    All other components within normal limits  CBC WITH DIFFERENTIAL/PLATELET  URINALYSIS, ROUTINE W REFLEX MICROSCOPIC    EKG: None  Radiology: CT ABDOMEN PELVIS WO CONTRAST Result Date: 10/17/2024 EXAM: CT ABDOMEN AND PELVIS WITHOUT CONTRAST 10/17/2024 11:19:53 AM TECHNIQUE: CT of the abdomen and pelvis was performed without the administration of intravenous contrast. Multiplanar reformatted images are provided for review. Automated exposure control, iterative reconstruction, and/or weight-based adjustment of the mA/kV was utilized to reduce the radiation dose to as low as reasonably achievable. COMPARISON: 03/08/2024 CLINICAL HISTORY: abd pain, ruq pain, hx of liver transplant. PT reports RUQ abdominal pain and N/V. Denies fevers. Pt with hx of liver transplant in 2013. FINDINGS: LOWER CHEST: Minimal right middle lobe subsegmental atelectasis. LIVER: Status post liver transplant. GALLBLADDER AND BILE DUCTS: Status post cholecystectomy. No biliary ductal dilatation. SPLEEN: No acute abnormality.  PANCREAS: No acute abnormality. ADRENAL GLANDS: No acute abnormality. KIDNEYS, URETERS AND BLADDER: No stones in the kidneys or ureters. No hydronephrosis. No perinephric or periureteral stranding. Urinary bladder is unremarkable. GI AND BOWEL: Stomach demonstrates no acute abnormality. There is no bowel obstruction. PERITONEUM AND RETROPERITONEUM: No ascites. No free air. VASCULATURE: Aorta is normal in caliber. Aortic atherosclerosis. LYMPH NODES: No lymphadenopathy. REPRODUCTIVE ORGANS: No acute abnormality. BONES AND SOFT TISSUES: No acute osseous abnormality. No focal soft tissue abnormality. IMPRESSION: 1. No acute findings in the abdomen or pelvis. 2. Aortic atherosclerosis. Electronically signed by: Lynwood Seip MD 10/17/2024 11:40 AM EDT RP Workstation: [id number]     Procedures   Medications Ordered in the ED  HYDROmorphone  (DILAUDID ) injection 1 mg (1 mg Intramuscular Given 10/17/24 1125)  lactated ringers  bolus 500 mL (0 mLs Intravenous Stopped 10/17/24 1402)  ondansetron  (ZOFRAN ) injection 4 mg (4 mg Intravenous Given 10/17/24 1211)                                    Medical Decision Making Cardiac monitor interpretation: Sinus rhythm, no ectopy  Patient here for nausea vomiting diarrhea and abdominal pain.  She has a history of liver transplant.  Her labs and CT scan are unremarkable.  She was given fluids Zofran  and pain medication and feeling much better.  She is able to tolerate p.o. without difficulty.  I will give her a prescription for Zofran  to use as needed.  Advised close follow-up with primary care and her specialist and otherwise return to the ER for new or worsening symptoms.  She feels comfortable to plan to be discharged home.  Problems Addressed: Abdominal pain, non-surgical: acute illness or injury Vomiting and diarrhea: acute illness or injury  Amount and/or Complexity of Data Reviewed External Data Reviewed: notes.    Details: Prior ED records reviewed and  patient seen 5-50 discharge by 5 for asthma exacerbation Labs: ordered. Decision-making details documented in ED Course.    Details: Ordered and reviewed by me and unremarkable Radiology: ordered  and independent interpretation performed. Decision-making details documented in ED Course.    Details: Ordered and reviewed by me and CT abdomen pelvis shows no acute abnormality in the abdomen  Risk OTC drugs. Prescription drug management. Parenteral controlled substances. Drug therapy requiring intensive monitoring for toxicity.     Final diagnoses:  Abdominal pain, non-surgical  Vomiting and diarrhea    ED Discharge Orders          Ordered    ondansetron  (ZOFRAN ) 4 MG tablet  Every 8 hours PRN,   Status:  Discontinued        10/17/24 1329    ondansetron  (ZOFRAN ) 4 MG tablet  Every 8 hours PRN        10/17/24 1356               Adiana Smelcer L, DO 10/17/24 1610

## 2024-10-17 NOTE — ED Notes (Signed)
 Unable to obtain IV access for repeat lab,  USIV requested

## 2024-10-17 NOTE — Discharge Instructions (Addendum)
 It is important that you call and follow-up with your primary care doctor.  Stay on your medications as prescribed.  You can use Zofran  as needed for nausea and vomiting.  You can take Imodium, which is over-the-counter as needed for diarrhea.

## 2024-10-17 NOTE — ED Triage Notes (Addendum)
 PT reports RUQ abdominal pain and N/V. Denies fevers. Pt with hx of liver transplant in 2013.

## 2024-11-01 ENCOUNTER — Encounter (HOSPITAL_COMMUNITY): Payer: Self-pay | Admitting: Emergency Medicine

## 2024-11-01 ENCOUNTER — Other Ambulatory Visit: Payer: Self-pay

## 2024-11-01 ENCOUNTER — Observation Stay (HOSPITAL_COMMUNITY)
Admission: EM | Admit: 2024-11-01 | Discharge: 2024-11-04 | Disposition: A | Discharge status: Home / Self Care | Attending: Emergency Medicine | Admitting: Emergency Medicine

## 2024-11-01 ENCOUNTER — Emergency Department (HOSPITAL_COMMUNITY)

## 2024-11-01 DIAGNOSIS — Z794 Long term (current) use of insulin: Secondary | ICD-10-CM | POA: Insufficient documentation

## 2024-11-01 DIAGNOSIS — Z6831 Body mass index (BMI) 31.0-31.9, adult: Secondary | ICD-10-CM | POA: Diagnosis not present

## 2024-11-01 DIAGNOSIS — N184 Chronic kidney disease, stage 4 (severe): Secondary | ICD-10-CM | POA: Insufficient documentation

## 2024-11-01 DIAGNOSIS — S0511XA Contusion of eyeball and orbital tissues, right eye, initial encounter: Principal | ICD-10-CM | POA: Insufficient documentation

## 2024-11-01 DIAGNOSIS — E1165 Type 2 diabetes mellitus with hyperglycemia: Secondary | ICD-10-CM | POA: Insufficient documentation

## 2024-11-01 DIAGNOSIS — E66811 Obesity, class 1: Secondary | ICD-10-CM | POA: Insufficient documentation

## 2024-11-01 DIAGNOSIS — R001 Bradycardia, unspecified: Secondary | ICD-10-CM | POA: Insufficient documentation

## 2024-11-01 DIAGNOSIS — I10 Essential (primary) hypertension: Secondary | ICD-10-CM | POA: Diagnosis present

## 2024-11-01 DIAGNOSIS — D84821 Immunodeficiency due to drugs: Secondary | ICD-10-CM | POA: Diagnosis not present

## 2024-11-01 DIAGNOSIS — E1122 Type 2 diabetes mellitus with diabetic chronic kidney disease: Secondary | ICD-10-CM | POA: Diagnosis not present

## 2024-11-01 DIAGNOSIS — S0501XA Injury of conjunctiva and corneal abrasion without foreign body, right eye, initial encounter: Secondary | ICD-10-CM

## 2024-11-01 DIAGNOSIS — Z79899 Other long term (current) drug therapy: Secondary | ICD-10-CM | POA: Insufficient documentation

## 2024-11-01 DIAGNOSIS — E119 Type 2 diabetes mellitus without complications: Secondary | ICD-10-CM

## 2024-11-01 DIAGNOSIS — S0990XA Unspecified injury of head, initial encounter: Secondary | ICD-10-CM | POA: Diagnosis present

## 2024-11-01 DIAGNOSIS — I129 Hypertensive chronic kidney disease with stage 1 through stage 4 chronic kidney disease, or unspecified chronic kidney disease: Secondary | ICD-10-CM | POA: Diagnosis not present

## 2024-11-01 DIAGNOSIS — E785 Hyperlipidemia, unspecified: Secondary | ICD-10-CM | POA: Diagnosis not present

## 2024-11-01 DIAGNOSIS — E039 Hypothyroidism, unspecified: Secondary | ICD-10-CM | POA: Diagnosis not present

## 2024-11-01 DIAGNOSIS — D631 Anemia in chronic kidney disease: Secondary | ICD-10-CM | POA: Diagnosis not present

## 2024-11-01 DIAGNOSIS — N179 Acute kidney failure, unspecified: Secondary | ICD-10-CM | POA: Diagnosis not present

## 2024-11-01 DIAGNOSIS — N390 Urinary tract infection, site not specified: Secondary | ICD-10-CM | POA: Diagnosis present

## 2024-11-01 DIAGNOSIS — Z944 Liver transplant status: Secondary | ICD-10-CM | POA: Insufficient documentation

## 2024-11-01 DIAGNOSIS — N189 Chronic kidney disease, unspecified: Secondary | ICD-10-CM | POA: Diagnosis present

## 2024-11-01 LAB — CBG MONITORING, ED: Glucose-Capillary: 408 mg/dL — ABNORMAL HIGH (ref 70–99)

## 2024-11-01 MED ORDER — CIPROFLOXACIN HCL 0.3 % OP SOLN
2.0000 [drp] | Freq: Four times a day (QID) | OPHTHALMIC | Status: DC
  Administered 2024-11-02 – 2024-11-04 (×9): 2 [drp] via OPHTHALMIC
  Filled 2024-11-01 (×3): qty 2.5

## 2024-11-01 MED ORDER — ONDANSETRON 4 MG PO TBDP
4.0000 mg | ORAL_TABLET | Freq: Once | ORAL | Status: AC
  Administered 2024-11-01: 4 mg via ORAL
  Filled 2024-11-01: qty 1

## 2024-11-01 MED ORDER — ACETAMINOPHEN 325 MG PO TABS
650.0000 mg | ORAL_TABLET | ORAL | Status: AC
  Administered 2024-11-01: 650 mg via ORAL
  Filled 2024-11-01: qty 2

## 2024-11-01 MED ORDER — OXYCODONE-ACETAMINOPHEN 5-325 MG PO TABS
1.0000 | ORAL_TABLET | Freq: Once | ORAL | Status: AC
  Administered 2024-11-01: 1 via ORAL
  Filled 2024-11-01: qty 1
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
## 2024-11-02 ENCOUNTER — Encounter (HOSPITAL_COMMUNITY): Payer: Self-pay | Admitting: Internal Medicine

## 2024-11-02 DIAGNOSIS — N3000 Acute cystitis without hematuria: Secondary | ICD-10-CM

## 2024-11-02 DIAGNOSIS — I10 Essential (primary) hypertension: Secondary | ICD-10-CM | POA: Diagnosis present

## 2024-11-02 DIAGNOSIS — E785 Hyperlipidemia, unspecified: Secondary | ICD-10-CM | POA: Diagnosis present

## 2024-11-02 DIAGNOSIS — N390 Urinary tract infection, site not specified: Secondary | ICD-10-CM | POA: Diagnosis present

## 2024-11-02 DIAGNOSIS — N179 Acute kidney failure, unspecified: Secondary | ICD-10-CM | POA: Diagnosis present

## 2024-11-02 DIAGNOSIS — D84821 Immunodeficiency due to drugs: Secondary | ICD-10-CM | POA: Diagnosis present

## 2024-11-02 DIAGNOSIS — E039 Hypothyroidism, unspecified: Secondary | ICD-10-CM | POA: Diagnosis present

## 2024-11-02 DIAGNOSIS — E119 Type 2 diabetes mellitus without complications: Secondary | ICD-10-CM

## 2024-11-02 DIAGNOSIS — Z944 Liver transplant status: Secondary | ICD-10-CM

## 2024-11-02 DIAGNOSIS — I159 Secondary hypertension, unspecified: Secondary | ICD-10-CM

## 2024-11-02 DIAGNOSIS — N189 Chronic kidney disease, unspecified: Secondary | ICD-10-CM

## 2024-11-02 LAB — URINALYSIS, ROUTINE W REFLEX MICROSCOPIC
Bilirubin Urine: NEGATIVE
Glucose, UA: >=500 mg/dL — AB
Hgb urine dipstick: NEGATIVE
Ketones, ur: NEGATIVE mg/dL
Nitrite: NEGATIVE
Protein, ur: 100 mg/dL — AB
Specific Gravity, Urine: 1.013 (ref 1.005–1.030)
pH: 5 (ref 5.0–8.0)

## 2024-11-02 LAB — CBC WITH DIFFERENTIAL/PLATELET
Abs Immature Granulocytes: 0.03 K/uL (ref 0.00–0.07)
Basophils Absolute: 0 K/uL (ref 0.0–0.1)
Basophils Relative: 0 %
Eosinophils Absolute: 0 K/uL (ref 0.0–0.5)
Eosinophils Relative: 0 %
HCT: 29 % — ABNORMAL LOW (ref 36.0–46.0)
Hemoglobin: 8.7 g/dL — ABNORMAL LOW (ref 12.0–15.0)
Immature Granulocytes: 0 %
Lymphocytes Relative: 13 %
Lymphs Abs: 0.9 K/uL (ref 0.7–4.0)
MCH: 21.6 pg — ABNORMAL LOW (ref 26.0–34.0)
MCHC: 30 g/dL (ref 30.0–36.0)
MCV: 72 fL — ABNORMAL LOW (ref 80.0–100.0)
Monocytes Absolute: 0.4 K/uL (ref 0.1–1.0)
Monocytes Relative: 5 %
Neutro Abs: 5.5 K/uL (ref 1.7–7.7)
Neutrophils Relative %: 82 %
Platelets: 170 K/uL (ref 150–400)
RBC: 4.03 MIL/uL (ref 3.87–5.11)
RDW: 16.2 % — ABNORMAL HIGH (ref 11.5–15.5)
WBC: 6.9 K/uL (ref 4.0–10.5)
nRBC: 0 % (ref 0.0–0.2)

## 2024-11-02 LAB — GLUCOSE, CAPILLARY
Glucose-Capillary: 243 mg/dL — ABNORMAL HIGH (ref 70–99)
Glucose-Capillary: 318 mg/dL — ABNORMAL HIGH (ref 70–99)
Glucose-Capillary: 105 mg/dL — ABNORMAL HIGH (ref 70–99)

## 2024-11-02 LAB — BASIC METABOLIC PANEL WITH GFR
Anion gap: 12 (ref 5–15)
BUN: 35 mg/dL — ABNORMAL HIGH (ref 8–23)
CO2: 19 mmol/L — ABNORMAL LOW (ref 22–32)
Calcium: 8.4 mg/dL — ABNORMAL LOW (ref 8.9–10.3)
Chloride: 101 mmol/L (ref 98–111)
Creatinine, Ser: 3.69 mg/dL — ABNORMAL HIGH (ref 0.44–1.00)
GFR, Estimated: 13 mL/min — ABNORMAL LOW (ref >60)
Glucose, Bld: 424 mg/dL — ABNORMAL HIGH (ref 70–99)
Potassium: 4.5 mmol/L (ref 3.5–5.1)
Sodium: 132 mmol/L — ABNORMAL LOW (ref 135–145)

## 2024-11-02 LAB — HEPATIC FUNCTION PANEL
ALT: 9 U/L (ref 0–44)
AST: 14 U/L — ABNORMAL LOW (ref 15–41)
Albumin: 3.6 g/dL (ref 3.5–5.0)
Alkaline Phosphatase: 118 U/L (ref 38–126)
Bilirubin, Direct: <0.1 mg/dL (ref 0.0–0.2)
Total Bilirubin: 0.5 mg/dL (ref 0.0–1.2)
Total Protein: 7.8 g/dL (ref 6.5–8.1)

## 2024-11-02 LAB — COMPREHENSIVE METABOLIC PANEL WITH GFR
ALT: 8 U/L (ref 0–44)
AST: 11 U/L — ABNORMAL LOW (ref 15–41)
Albumin: 2.8 g/dL — ABNORMAL LOW (ref 3.5–5.0)
Alkaline Phosphatase: 90 U/L (ref 38–126)
Anion gap: 11 (ref 5–15)
BUN: 33 mg/dL — ABNORMAL HIGH (ref 8–23)
CO2: 19 mmol/L — ABNORMAL LOW (ref 22–32)
Calcium: 7.8 mg/dL — ABNORMAL LOW (ref 8.9–10.3)
Chloride: 103 mmol/L (ref 98–111)
Creatinine, Ser: 3.52 mg/dL — ABNORMAL HIGH (ref 0.44–1.00)
GFR, Estimated: 13 mL/min — ABNORMAL LOW (ref >60)
Glucose, Bld: 467 mg/dL — ABNORMAL HIGH (ref 70–99)
Potassium: 4.3 mmol/L (ref 3.5–5.1)
Sodium: 133 mmol/L — ABNORMAL LOW (ref 135–145)
Total Bilirubin: 0.2 mg/dL (ref 0.0–1.2)
Total Protein: 6.1 g/dL — ABNORMAL LOW (ref 6.5–8.1)

## 2024-11-02 LAB — CBC
HCT: 34.3 % — ABNORMAL LOW (ref 36.0–46.0)
Hemoglobin: 10.3 g/dL — ABNORMAL LOW (ref 12.0–15.0)
MCH: 21.8 pg — ABNORMAL LOW (ref 26.0–34.0)
MCHC: 30 g/dL (ref 30.0–36.0)
MCV: 72.7 fL — ABNORMAL LOW (ref 80.0–100.0)
Platelets: 191 K/uL (ref 150–400)
RBC: 4.72 MIL/uL (ref 3.87–5.11)
RDW: 15.9 % — ABNORMAL HIGH (ref 11.5–15.5)
WBC: 9.1 K/uL (ref 4.0–10.5)
nRBC: 0 % (ref 0.0–0.2)

## 2024-11-02 LAB — CBG MONITORING, ED
Glucose-Capillary: 441 mg/dL — ABNORMAL HIGH (ref 70–99)
Glucose-Capillary: 437 mg/dL — ABNORMAL HIGH (ref 70–99)
Glucose-Capillary: 120 mg/dL — ABNORMAL HIGH (ref 70–99)
Glucose-Capillary: 89 mg/dL (ref 70–99)

## 2024-11-02 LAB — PHOSPHORUS: Phosphorus: 4.1 mg/dL (ref 2.5–4.6)

## 2024-11-02 LAB — MAGNESIUM: Magnesium: 1.2 mg/dL — ABNORMAL LOW (ref 1.7–2.4)

## 2024-11-02 LAB — HEMOGLOBIN A1C
Hgb A1c MFr Bld: 10.3 % — ABNORMAL HIGH (ref 4.8–5.6)
Mean Plasma Glucose: 248.91 mg/dL

## 2024-11-02 LAB — CK: Total CK: 82 U/L (ref 38–234)

## 2024-11-02 MED ORDER — CEFTRIAXONE SODIUM 2 G IJ SOLR: 2.0000 g | INTRAMUSCULAR | Status: DC

## 2024-11-02 MED ORDER — SODIUM CHLORIDE 0.9 % IV SOLN
1.0000 g | INTRAVENOUS | Status: DC
  Administered 2024-11-02 – 2024-11-04 (×3): 1 g via INTRAVENOUS
  Filled 2024-11-02 (×3): qty 10

## 2024-11-02 MED ORDER — INSULIN ASPART 100 UNIT/ML IJ SOLN
0.0000 [IU] | Freq: Three times a day (TID) | INTRAMUSCULAR | Status: DC
  Administered 2024-11-02: 7 [IU] via SUBCUTANEOUS
  Administered 2024-11-02: 3 [IU] via SUBCUTANEOUS
  Administered 2024-11-03: 7 [IU] via SUBCUTANEOUS
  Administered 2024-11-03: 1 [IU] via SUBCUTANEOUS
  Administered 2024-11-03: 3 [IU] via SUBCUTANEOUS
  Filled 2024-11-02: qty 1
  Filled 2024-11-02: qty 2
  Filled 2024-11-02: qty 7
  Filled 2024-11-02: qty 1
  Filled 2024-11-02: qty 7
  Filled 2024-11-02: qty 3

## 2024-11-02 MED ORDER — ONDANSETRON HCL 4 MG/2ML IJ SOLN
4.0000 mg | Freq: Four times a day (QID) | INTRAMUSCULAR | Status: DC | PRN
  Administered 2024-11-02: 4 mg via INTRAVENOUS
  Filled 2024-11-02: qty 2

## 2024-11-02 MED ORDER — MYCOPHENOLATE SODIUM 180 MG PO TBEC
360.0000 mg | DELAYED_RELEASE_TABLET | Freq: Two times a day (BID) | ORAL | Status: DC
  Administered 2024-11-02 – 2024-11-04 (×4): 360 mg via ORAL
  Filled 2024-11-02 (×4): qty 2

## 2024-11-02 MED ORDER — ALBUTEROL SULFATE (2.5 MG/3ML) 0.083% IN NEBU: 2.5000 mg | INHALATION_SOLUTION | Freq: Four times a day (QID) | RESPIRATORY_TRACT | Status: DC | PRN

## 2024-11-02 MED ORDER — GUAIFENESIN ER 600 MG PO TB12
1200.0000 mg | ORAL_TABLET | Freq: Two times a day (BID) | ORAL | Status: DC
  Administered 2024-11-02 – 2024-11-04 (×4): 1200 mg via ORAL
  Filled 2024-11-02 (×4): qty 2

## 2024-11-02 MED ORDER — LISINOPRIL 5 MG PO TABS
10.0000 mg | ORAL_TABLET | Freq: Every day | ORAL | Status: DC
  Administered 2024-11-02 – 2024-11-03 (×2): 10 mg via ORAL
  Filled 2024-11-02 (×2): qty 2

## 2024-11-02 MED ORDER — INSULIN ASPART 100 UNIT/ML IJ SOLN
8.0000 [IU] | Freq: Once | INTRAMUSCULAR | Status: AC
  Administered 2024-11-02: 8 [IU] via SUBCUTANEOUS
  Filled 2024-11-02: qty 8

## 2024-11-02 MED ORDER — MELATONIN 3 MG PO TABS
3.0000 mg | ORAL_TABLET | Freq: Every evening | ORAL | Status: DC | PRN
  Administered 2024-11-02: 3 mg via ORAL
  Filled 2024-11-02: qty 1

## 2024-11-02 MED ORDER — OXYCODONE-ACETAMINOPHEN 5-325 MG PO TABS
1.0000 | ORAL_TABLET | Freq: Four times a day (QID) | ORAL | Status: DC | PRN
  Administered 2024-11-02: 1 via ORAL
  Filled 2024-11-02 (×2): qty 1

## 2024-11-02 MED ORDER — INSULIN GLARGINE-YFGN 100 UNIT/ML ~~LOC~~ SOLN
5.0000 [IU] | Freq: Every day | SUBCUTANEOUS | Status: DC
  Administered 2024-11-02: 5 [IU] via SUBCUTANEOUS
  Filled 2024-11-02: qty 0.05

## 2024-11-02 MED ORDER — HYDRALAZINE HCL 20 MG/ML IJ SOLN: 10.0000 mg | INTRAMUSCULAR | Status: DC | PRN

## 2024-11-02 MED ORDER — SODIUM CHLORIDE 0.9 % IV BOLUS
1000.0000 mL | Freq: Once | INTRAVENOUS | Status: AC
  Administered 2024-11-02: 1000 mL via INTRAVENOUS

## 2024-11-02 MED ORDER — MAGNESIUM SULFATE 2 GM/50ML IV SOLN
2.0000 g | Freq: Once | INTRAVENOUS | Status: AC
  Administered 2024-11-02: 2 g via INTRAVENOUS
  Filled 2024-11-02: qty 50

## 2024-11-02 MED ORDER — HYDRALAZINE HCL 10 MG PO TABS
10.0000 mg | ORAL_TABLET | Freq: Three times a day (TID) | ORAL | Status: DC
  Administered 2024-11-02 – 2024-11-04 (×5): 10 mg via ORAL
  Filled 2024-11-02 (×5): qty 1

## 2024-11-02 MED ORDER — INSULIN GLARGINE-YFGN 100 UNIT/ML ~~LOC~~ SOLN
5.0000 [IU] | Freq: Once | SUBCUTANEOUS | Status: AC
  Administered 2024-11-02: 5 [IU] via SUBCUTANEOUS
  Filled 2024-11-02: qty 0.05

## 2024-11-02 MED ORDER — CYCLOSPORINE MODIFIED (GENGRAF) 25 MG PO CAPS
75.0000 mg | ORAL_CAPSULE | Freq: Two times a day (BID) | ORAL | Status: DC
  Administered 2024-11-02 – 2024-11-04 (×4): 75 mg via ORAL
  Filled 2024-11-02 (×4): qty 3

## 2024-11-02 MED ORDER — INSULIN ASPART 100 UNIT/ML IJ SOLN
0.0000 [IU] | INTRAMUSCULAR | Status: DC
  Administered 2024-11-02: 6 [IU] via SUBCUTANEOUS
  Filled 2024-11-02: qty 6

## 2024-11-02 MED ORDER — INSULIN ASPART 100 UNIT/ML IJ SOLN
0.0000 [IU] | Freq: Every day | INTRAMUSCULAR | Status: DC
  Administered 2024-11-03: 2 [IU] via SUBCUTANEOUS
  Filled 2024-11-02: qty 1
  Filled 2024-11-02: qty 2

## 2024-11-02 MED ORDER — DULOXETINE HCL 30 MG PO CPEP
30.0000 mg | ORAL_CAPSULE | Freq: Every day | ORAL | Status: DC
  Administered 2024-11-02 – 2024-11-04 (×3): 30 mg via ORAL
  Filled 2024-11-02 (×3): qty 1

## 2024-11-02 MED ORDER — BENZONATATE 100 MG PO CAPS: 100.0000 mg | ORAL_CAPSULE | Freq: Three times a day (TID) | ORAL | Status: DC | PRN

## 2024-11-02 MED ORDER — ACETAMINOPHEN 325 MG PO TABS
650.0000 mg | ORAL_TABLET | Freq: Four times a day (QID) | ORAL | Status: DC | PRN
  Filled 2024-11-02: qty 2

## 2024-11-02 MED ORDER — PANTOPRAZOLE SODIUM 40 MG PO TBEC
40.0000 mg | DELAYED_RELEASE_TABLET | Freq: Every day | ORAL | Status: DC
  Administered 2024-11-02 – 2024-11-04 (×3): 40 mg via ORAL
  Filled 2024-11-02 (×3): qty 1

## 2024-11-02 MED ORDER — HEPARIN SODIUM (PORCINE) 5000 UNIT/ML IJ SOLN
5000.0000 [IU] | Freq: Three times a day (TID) | INTRAMUSCULAR | Status: DC
  Administered 2024-11-02 – 2024-11-04 (×6): 5000 [IU] via SUBCUTANEOUS
  Filled 2024-11-02 (×6): qty 1

## 2024-11-02 MED ORDER — PROCHLORPERAZINE EDISYLATE 10 MG/2ML IJ SOLN: 5.0000 mg | Freq: Four times a day (QID) | INTRAMUSCULAR | Status: DC | PRN

## 2024-11-02 MED ORDER — SODIUM CHLORIDE 0.9% FLUSH
3.0000 mL | Freq: Two times a day (BID) | INTRAVENOUS | Status: DC
  Administered 2024-11-02 – 2024-11-04 (×5): 3 mL via INTRAVENOUS

## 2024-11-02 MED ORDER — ATORVASTATIN CALCIUM 10 MG PO TABS
10.0000 mg | ORAL_TABLET | Freq: Every day | ORAL | Status: DC
  Administered 2024-11-02 – 2024-11-04 (×3): 10 mg via ORAL
  Filled 2024-11-02 (×3): qty 1

## 2024-11-02 MED ORDER — ACETAMINOPHEN 650 MG RE SUPP: 650.0000 mg | Freq: Four times a day (QID) | RECTAL | Status: DC | PRN

## 2024-11-02 MED ORDER — CLONIDINE HCL 0.1 MG PO TABS
0.1000 mg | ORAL_TABLET | Freq: Two times a day (BID) | ORAL | Status: DC
  Administered 2024-11-02 – 2024-11-04 (×4): 0.1 mg via ORAL
  Filled 2024-11-02 (×4): qty 1

## 2024-11-02 MED ORDER — LACTATED RINGERS IV SOLN: INTRAVENOUS | Status: AC

## 2024-11-02 MED ORDER — GABAPENTIN 300 MG PO CAPS
600.0000 mg | ORAL_CAPSULE | Freq: Three times a day (TID) | ORAL | Status: DC
  Administered 2024-11-02 – 2024-11-04 (×5): 600 mg via ORAL
  Filled 2024-11-02 (×5): qty 2

## 2024-11-02 MED ORDER — INSULIN GLARGINE-YFGN 100 UNIT/ML ~~LOC~~ SOLN
10.0000 [IU] | Freq: Every day | SUBCUTANEOUS | Status: DC
  Administered 2024-11-03: 10 [IU] via SUBCUTANEOUS
  Filled 2024-11-02 (×2): qty 0.1

## 2024-11-02 MED ORDER — AMLODIPINE BESYLATE 10 MG PO TABS
10.0000 mg | ORAL_TABLET | Freq: Every evening | ORAL | Status: DC
  Administered 2024-11-02 – 2024-11-03 (×2): 10 mg via ORAL
  Filled 2024-11-02 (×2): qty 1

## 2024-11-02 MED ORDER — LEVOTHYROXINE SODIUM 112 MCG PO TABS
112.0000 ug | ORAL_TABLET | ORAL | Status: DC
  Administered 2024-11-03 – 2024-11-04 (×2): 112 ug via ORAL
  Filled 2024-11-02 (×2): qty 1

## 2024-11-02 MED ORDER — LATANOPROST 0.005 % OP SOLN
1.0000 [drp] | Freq: Every day | OPHTHALMIC | Status: DC
Start: 2024-11-02 — End: 2024-11-04
  Administered 2024-11-02 – 2024-11-03 (×2): 1 [drp] via OPHTHALMIC
  Filled 2024-11-02: qty 2.5

## 2024-11-02 NOTE — ED Notes (Signed)
 Pt's ciporfloxacin drops were not found in the previous room or section she was moved from. Rx nowhere in the room with pt belongings.

## 2024-11-02 NOTE — Inpatient Diabetes Management (Signed)
 Inpatient Diabetes Program Recommendations  AACE/ADA: New Consensus Statement on Inpatient Glycemic Control (2015)  Target Ranges:  Prepandial:   less than 140 mg/dL      Peak postprandial:   less than 180 mg/dL (1-2 hours)      Critically ill patients:  140 - 180 mg/dL   Lab Results  Component Value Date   GLUCAP 89 11/02/2024    Review of Glycemic Control  Latest Reference Range & Units 11/02/24 03:18 11/02/24 04:37 11/02/24 07:53 11/02/24 08:41  Glucose-Capillary 70 - 99 mg/dL 558 (H) 562 (H) 879 (H) 89  (H): Data is abnormally high Diabetes history: Type 2 DM Outpatient Diabetes medications: Lantus 20 units at bedtime, Humalog 8 units TID Current orders for Inpatient glycemic control: Novolog 0-9 units TID & HS  A1C in process  Inpatient Diabetes Program Recommendations:    Consider adding Semglee 10 units every day.   Thanks, Tinnie Minus, MSN, RNC-OB Diabetes Coordinator [phone] (8a-5p)

## 2024-11-02 NOTE — ED Notes (Signed)
 Daughter, Sallee would like for her mother to give her a call at [phone]. RN aware.

## 2024-11-02 NOTE — Plan of Care (Signed)

## 2024-11-02 NOTE — ED Notes (Signed)
 CCMD called to add pt to monitor.
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
## 2024-11-02 NOTE — ED Notes (Signed)
 Requested for US  to page MD for glucose 441
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
## 2024-11-02 NOTE — ED Notes (Signed)
 Pharmacy messaged for Ciprofloxacin ophthalmic drops.

## 2024-11-02 NOTE — ED Notes (Signed)
 Pt ambulated to the restroom and back with 1 person assist. Tolerated well. Stable gait.

## 2024-11-02 NOTE — Care Management Obs Status (Signed)
 MEDICARE OBSERVATION STATUS NOTIFICATION   Patient Details  Name: Tammy Wilcox MRN: [id number] Date of Birth: 06-Dec-1956   Medicare Observation Status Notification Given:  Yes  Obs signed and copy given   Claretta Deed 11/02/2024, 1:32 PM
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
## 2024-11-03 ENCOUNTER — Other Ambulatory Visit (HOSPITAL_COMMUNITY): Payer: Self-pay

## 2024-11-03 ENCOUNTER — Telehealth (HOSPITAL_COMMUNITY): Payer: Self-pay

## 2024-11-03 DIAGNOSIS — E119 Type 2 diabetes mellitus without complications: Secondary | ICD-10-CM | POA: Diagnosis not present

## 2024-11-03 DIAGNOSIS — N3000 Acute cystitis without hematuria: Secondary | ICD-10-CM | POA: Diagnosis not present

## 2024-11-03 DIAGNOSIS — N179 Acute kidney failure, unspecified: Secondary | ICD-10-CM | POA: Diagnosis not present

## 2024-11-03 LAB — CBC
HCT: 28.3 % — ABNORMAL LOW (ref 36.0–46.0)
Hemoglobin: 8.5 g/dL — ABNORMAL LOW (ref 12.0–15.0)
MCH: 21.6 pg — ABNORMAL LOW (ref 26.0–34.0)
MCHC: 30 g/dL (ref 30.0–36.0)
MCV: 72 fL — ABNORMAL LOW (ref 80.0–100.0)
Platelets: 154 K/uL (ref 150–400)
RBC: 3.93 MIL/uL (ref 3.87–5.11)
RDW: 15.8 % — ABNORMAL HIGH (ref 11.5–15.5)
WBC: 6.3 K/uL (ref 4.0–10.5)
nRBC: 0 % (ref 0.0–0.2)

## 2024-11-03 LAB — BASIC METABOLIC PANEL WITH GFR
Anion gap: 12 (ref 5–15)
BUN: 32 mg/dL — ABNORMAL HIGH (ref 8–23)
CO2: 20 mmol/L — ABNORMAL LOW (ref 22–32)
Calcium: 8 mg/dL — ABNORMAL LOW (ref 8.9–10.3)
Chloride: 106 mmol/L (ref 98–111)
Creatinine, Ser: 3.29 mg/dL — ABNORMAL HIGH (ref 0.44–1.00)
GFR, Estimated: 15 mL/min — ABNORMAL LOW (ref >60)
Glucose, Bld: 139 mg/dL — ABNORMAL HIGH (ref 70–99)
Potassium: 4.1 mmol/L (ref 3.5–5.1)
Sodium: 138 mmol/L (ref 135–145)

## 2024-11-03 LAB — GLUCOSE, CAPILLARY
Glucose-Capillary: 146 mg/dL — ABNORMAL HIGH (ref 70–99)
Glucose-Capillary: 338 mg/dL — ABNORMAL HIGH (ref 70–99)
Glucose-Capillary: 243 mg/dL — ABNORMAL HIGH (ref 70–99)
Glucose-Capillary: 205 mg/dL — ABNORMAL HIGH (ref 70–99)

## 2024-11-03 LAB — URINE CULTURE: Culture: <10000 — AB

## 2024-11-03 MED ORDER — SODIUM CHLORIDE 0.9 % IV SOLN: INTRAVENOUS | Status: DC

## 2024-11-03 MED ORDER — INSULIN GLARGINE-YFGN 100 UNIT/ML ~~LOC~~ SOLN
15.0000 [IU] | Freq: Every day | SUBCUTANEOUS | Status: DC
  Administered 2024-11-03 – 2024-11-04 (×2): 15 [IU] via SUBCUTANEOUS
  Filled 2024-11-03 (×3): qty 0.15

## 2024-11-03 NOTE — Progress Notes (Signed)
   11/03/24 1029  Mobility  Activity Ambulated independently  Level of Assistance Standby assist, set-up cues, supervision of patient - no hands on  Assistive Device None  Distance Ambulated (ft) 100 ft  Activity Response Tolerated fair  Mobility Referral Yes  Mobility visit 1 Mobility  Mobility Specialist Start Time (ACUTE ONLY) 1029  Mobility Specialist Stop Time (ACUTE ONLY) 1038  Mobility Specialist Time Calculation (min) (ACUTE ONLY) 9 min   Mobility Specialist: Progress Note  Pre-Mobility:      HR 86, SpO2 95% RA  Pt agreeable to mobility session - received in bed. C/o gout in R foot. Returned to EOB with all needs met - call bell within reach.  Additional comments:  Virgle Boards, BS Mobility Specialist Please contact via SecureChat or  Rehab office at [phone].

## 2024-11-03 NOTE — Telephone Encounter (Signed)
 Pharmacy Patient Advocate Encounter  Insurance verification completed.    The patient is insured through [facility]. Patient has [employer], may use a copay card, and/or apply for patient assistance if available.    Ran test claim for Freestyle Libre 3 Plus Sensor and the current 30 day co-pay is $0.   This test claim was processed through [employer]- copay amounts may vary at other pharmacies due to [employer], or as the patient moves through the different stages of their insurance plan.
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
## 2024-11-03 NOTE — Inpatient Diabetes Management (Signed)
 Inpatient Diabetes Program Recommendations  AACE/ADA: New Consensus Statement on Inpatient Glycemic Control (2015)  Target Ranges:  Prepandial:   less than 140 mg/dL      Peak postprandial:   less than 180 mg/dL (1-2 hours)      Critically ill patients:  140 - 180 mg/dL   Lab Results  Component Value Date   GLUCAP 338 (H) 11/03/2024   HGBA1C 10.3 (H) 11/02/2024    Review of Glycemic Control  Latest Reference Range & Units 11/02/24 17:32 11/02/24 21:45 11/03/24 07:52 11/03/24 11:14  Glucose-Capillary 70 - 99 mg/dL 681 (H) 894 (H) 853 (H) 338 (H)  (H): Data is abnormally high Diabetes history: Type 2 DM Outpatient Diabetes medications: Lantus 20 units at bedtime, Humalog 8 units TID Current orders for Inpatient glycemic control: Novolog 0-9 units TID & HS, Semglee 15 units QD    Inpatient Diabetes Program Recommendations:    Consider adding Novolog 3 units TID (Assuming patient consuming >50% of meals)  Spoke with patient regarding outpatient diabetes management.  Reviewed patient's current A1c of 10.3%. Explained what a A1c is and what it measures. Also reviewed goal A1c with patient, importance of good glucose control @ home, and blood sugar goals. Reviewed patho of DM, need for improved control, impact on infection risk, hypo vs hyper glycemia, interventions, vascular changes and commorbidities.  Patent is wearing Freestyle libre 2 sensor and reports blood sugars in 200's mg/dL. Encouraged to reach out to PCP and make appointment.  Admits to drinking large amounts of sweet tea and pepsi. Reviewed alternatives to sugary beverages and importance of protein.  No additional questions.   Thanks, Tinnie Minus, MSN, RNC-OB Diabetes Coordinator [phone] (8a-5p)
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
## 2024-11-04 ENCOUNTER — Other Ambulatory Visit (HOSPITAL_COMMUNITY): Payer: Self-pay

## 2024-11-04 DIAGNOSIS — N179 Acute kidney failure, unspecified: Secondary | ICD-10-CM | POA: Diagnosis not present

## 2024-11-04 DIAGNOSIS — N3000 Acute cystitis without hematuria: Secondary | ICD-10-CM | POA: Diagnosis not present

## 2024-11-04 DIAGNOSIS — E119 Type 2 diabetes mellitus without complications: Secondary | ICD-10-CM | POA: Diagnosis not present

## 2024-11-04 LAB — BASIC METABOLIC PANEL WITH GFR
Anion gap: 10 (ref 5–15)
BUN: 30 mg/dL — ABNORMAL HIGH (ref 8–23)
CO2: 18 mmol/L — ABNORMAL LOW (ref 22–32)
Calcium: 7.8 mg/dL — ABNORMAL LOW (ref 8.9–10.3)
Chloride: 109 mmol/L (ref 98–111)
Creatinine, Ser: 2.78 mg/dL — ABNORMAL HIGH (ref 0.44–1.00)
GFR, Estimated: 18 mL/min — ABNORMAL LOW (ref >60)
Glucose, Bld: 109 mg/dL — ABNORMAL HIGH (ref 70–99)
Potassium: 4 mmol/L (ref 3.5–5.1)
Sodium: 137 mmol/L (ref 135–145)

## 2024-11-04 LAB — GLUCOSE, CAPILLARY: Glucose-Capillary: 71 mg/dL (ref 70–99)

## 2024-11-04 MED ORDER — PREDNISOLONE ACETATE 1 % OP SUSP
1.0000 [drp] | Freq: Four times a day (QID) | OPHTHALMIC | 0 refills | Status: AC
  Filled 2024-11-04: qty 5, 25d supply, fill #0

## 2024-11-04 MED ORDER — PREDNISOLONE ACETATE 1 % OP SUSP
1.0000 [drp] | Freq: Four times a day (QID) | OPHTHALMIC | Status: DC
  Administered 2024-11-04: 1 [drp] via OPHTHALMIC
  Filled 2024-11-04: qty 5

## 2024-11-04 NOTE — Plan of Care (Signed)
 Problem: Education: Goal: Ability to describe self-care measures that may prevent or decrease complications (Diabetes Survival Skills Education) will improve 11/04/2024 1056 by Terryl Lauraine LABOR, RN Outcome: Adequate for Discharge 11/04/2024 1055 by Terryl Lauraine LABOR, RN Outcome: Adequate for Discharge Goal: Individualized Educational Video(s) 11/04/2024 1056 by Terryl Lauraine LABOR, RN Outcome: Adequate for Discharge 11/04/2024 1055 by Terryl Lauraine LABOR, RN Outcome: Adequate for Discharge   Problem: Coping: Goal: Ability to adjust to condition or change in health will improve 11/04/2024 1056 by Terryl Lauraine LABOR, RN Outcome: Adequate for Discharge 11/04/2024 1055 by Terryl Lauraine LABOR, RN Outcome: Adequate for Discharge   Problem: Fluid Volume: Goal: Ability to maintain a balanced intake and output will improve 11/04/2024 1056 by Terryl Lauraine LABOR, RN Outcome: Adequate for Discharge 11/04/2024 1055 by Terryl Lauraine LABOR, RN Outcome: Adequate for Discharge   Problem: Health Behavior/Discharge Planning: Goal: Ability to identify and utilize available resources and services will improve 11/04/2024 1056 by Terryl Lauraine LABOR, RN Outcome: Adequate for Discharge 11/04/2024 1055 by Terryl Lauraine LABOR, RN Outcome: Adequate for Discharge Goal: Ability to manage health-related needs will improve 11/04/2024 1056 by Terryl Lauraine LABOR, RN Outcome: Adequate for Discharge 11/04/2024 1055 by Terryl Lauraine LABOR, RN Outcome: Adequate for Discharge   Problem: Metabolic: Goal: Ability to maintain appropriate glucose levels will improve 11/04/2024 1056 by Terryl Lauraine LABOR, RN Outcome: Adequate for Discharge 11/04/2024 1055 by Terryl Lauraine LABOR, RN Outcome: Adequate for Discharge   Problem: Nutritional: Goal: Maintenance of adequate nutrition will improve 11/04/2024 1056 by Terryl Lauraine LABOR, RN Outcome: Adequate for Discharge 11/04/2024 1055 by Terryl Lauraine LABOR, RN Outcome: Adequate for Discharge Goal: Progress toward  achieving an optimal weight will improve 11/04/2024 1056 by Terryl Lauraine LABOR, RN Outcome: Adequate for Discharge 11/04/2024 1055 by Terryl Lauraine LABOR, RN Outcome: Adequate for Discharge   Problem: Skin Integrity: Goal: Risk for impaired skin integrity will decrease 11/04/2024 1056 by Terryl Lauraine LABOR, RN Outcome: Adequate for Discharge 11/04/2024 1055 by Terryl Lauraine LABOR, RN Outcome: Adequate for Discharge   Problem: Tissue Perfusion: Goal: Adequacy of tissue perfusion will improve 11/04/2024 1056 by Terryl Lauraine LABOR, RN Outcome: Adequate for Discharge 11/04/2024 1055 by Terryl Lauraine LABOR, RN Outcome: Adequate for Discharge   Problem: Education: Goal: Knowledge of General Education information will improve Description: Including pain rating scale, medication(s)/side effects and non-pharmacologic comfort measures 11/04/2024 1056 by Terryl Lauraine LABOR, RN Outcome: Adequate for Discharge 11/04/2024 1055 by Terryl Lauraine LABOR, RN Outcome: Adequate for Discharge   Problem: Health Behavior/Discharge Planning: Goal: Ability to manage health-related needs will improve 11/04/2024 1056 by Terryl Lauraine LABOR, RN Outcome: Adequate for Discharge 11/04/2024 1055 by Terryl Lauraine LABOR, RN Outcome: Adequate for Discharge   Problem: Clinical Measurements: Goal: Ability to maintain clinical measurements within normal limits will improve 11/04/2024 1056 by Terryl Lauraine LABOR, RN Outcome: Adequate for Discharge 11/04/2024 1055 by Terryl Lauraine LABOR, RN Outcome: Adequate for Discharge Goal: Will remain free from infection 11/04/2024 1056 by Terryl Lauraine LABOR, RN Outcome: Adequate for Discharge 11/04/2024 1055 by Terryl Lauraine LABOR, RN Outcome: Adequate for Discharge Goal: Diagnostic test results will improve 11/04/2024 1056 by Terryl Lauraine LABOR, RN Outcome: Adequate for Discharge 11/04/2024 1055 by Terryl Lauraine LABOR, RN Outcome: Adequate for Discharge Goal: Respiratory complications will improve 11/04/2024 1056 by  Terryl Lauraine LABOR, RN Outcome: Adequate for Discharge 11/04/2024 1055 by Terryl Lauraine LABOR, RN Outcome: Adequate for Discharge Goal: Cardiovascular complication will be avoided 11/04/2024 1056 by Terryl Lauraine LABOR, RN Outcome:  Adequate for Discharge 11/04/2024 1055 by Terryl Lauraine LABOR, RN Outcome: Adequate for Discharge   Problem: Activity: Goal: Risk for activity intolerance will decrease 11/04/2024 1056 by Terryl Lauraine LABOR, RN Outcome: Adequate for Discharge 11/04/2024 1055 by Terryl Lauraine LABOR, RN Outcome: Adequate for Discharge   Problem: Nutrition: Goal: Adequate nutrition will be maintained 11/04/2024 1056 by Terryl Lauraine LABOR, RN Outcome: Adequate for Discharge 11/04/2024 1055 by Terryl Lauraine LABOR, RN Outcome: Adequate for Discharge   Problem: Coping: Goal: Level of anxiety will decrease 11/04/2024 1056 by Terryl Lauraine LABOR, RN Outcome: Adequate for Discharge 11/04/2024 1055 by Terryl Lauraine LABOR, RN Outcome: Adequate for Discharge   Problem: Elimination: Goal: Will not experience complications related to bowel motility 11/04/2024 1056 by Terryl Lauraine LABOR, RN Outcome: Adequate for Discharge 11/04/2024 1055 by Terryl Lauraine LABOR, RN Outcome: Adequate for Discharge Goal: Will not experience complications related to urinary retention 11/04/2024 1056 by Terryl Lauraine LABOR, RN Outcome: Adequate for Discharge 11/04/2024 1055 by Terryl Lauraine LABOR, RN Outcome: Adequate for Discharge   Problem: Pain Managment: Goal: General experience of comfort will improve and/or be controlled 11/04/2024 1056 by Terryl Lauraine LABOR, RN Outcome: Adequate for Discharge 11/04/2024 1055 by Terryl Lauraine LABOR, RN Outcome: Adequate for Discharge   Problem: Safety: Goal: Ability to remain free from injury will improve 11/04/2024 1056 by Terryl Lauraine LABOR, RN Outcome: Adequate for Discharge 11/04/2024 1055 by Terryl Lauraine LABOR, RN Outcome: Adequate for Discharge   Problem: Skin Integrity: Goal: Risk for impaired skin  integrity will decrease 11/04/2024 1056 by Terryl Lauraine LABOR, RN Outcome: Adequate for Discharge 11/04/2024 1055 by Terryl Lauraine LABOR, RN Outcome: Adequate for Discharge

## 2024-11-04 NOTE — Plan of Care (Signed)
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
